# Patient Record
Sex: Male | Born: 1940 | Race: White | Hispanic: No | Marital: Married
Health system: Midwestern US, Academic
[De-identification: ages and names within clinical notes are randomized; demographics above are authoritative.]

---

## 2016-09-21 ENCOUNTER — Encounter: Admit: 2016-09-21 | Discharge: 2016-09-21 | Payer: MEDICARE

## 2016-09-30 ENCOUNTER — Encounter: Admit: 2016-09-30 | Discharge: 2016-09-30 | Payer: MEDICARE

## 2016-09-30 DIAGNOSIS — I10 Essential (primary) hypertension: ICD-10-CM

## 2016-09-30 DIAGNOSIS — E7800 Pure hypercholesterolemia, unspecified: ICD-10-CM

## 2016-09-30 DIAGNOSIS — I2510 Atherosclerotic heart disease of native coronary artery without angina pectoris: ICD-10-CM

## 2016-09-30 DIAGNOSIS — I25810 Atherosclerosis of coronary artery bypass graft(s) without angina pectoris: Principal | ICD-10-CM

## 2016-09-30 DIAGNOSIS — I4819 Other persistent atrial fibrillation: ICD-10-CM

## 2016-09-30 MED ORDER — NITROGLYCERIN 0.4 MG SL SUBL
.4 mg | ORAL_TABLET | SUBLINGUAL | 3 refills | 9.00000 days | Status: AC | PRN
Start: 2016-09-30 — End: 2019-02-26

## 2016-10-05 ENCOUNTER — Ambulatory Visit: Admit: 2016-10-05 | Discharge: 2016-10-06 | Payer: MEDICARE

## 2016-10-06 DIAGNOSIS — Z959 Presence of cardiac and vascular implant and graft, unspecified: Principal | ICD-10-CM

## 2016-10-10 ENCOUNTER — Encounter: Admit: 2016-10-10 | Discharge: 2016-10-10 | Payer: MEDICARE

## 2016-10-10 MED ORDER — AMIODARONE 200 MG PO TAB
ORAL_TABLET | ORAL | 3 refills | 42.00000 days | Status: AC
Start: 2016-10-10 — End: 2016-11-09

## 2016-11-04 ENCOUNTER — Ambulatory Visit: Admit: 2016-11-04 | Discharge: 2016-11-05 | Payer: MEDICARE

## 2016-11-05 DIAGNOSIS — I481 Persistent atrial fibrillation: Principal | ICD-10-CM

## 2016-11-05 DIAGNOSIS — Z959 Presence of cardiac and vascular implant and graft, unspecified: ICD-10-CM

## 2016-11-08 ENCOUNTER — Encounter: Admit: 2016-11-08 | Discharge: 2016-11-08 | Payer: MEDICARE

## 2016-11-09 ENCOUNTER — Encounter: Admit: 2016-11-09 | Discharge: 2016-11-09 | Payer: MEDICARE

## 2016-11-09 MED ORDER — AMIODARONE 200 MG PO TAB
400 mg | ORAL_TABLET | Freq: Every day | ORAL | 3 refills | 42.00000 days | Status: AC
Start: 2016-11-09 — End: 2017-01-23

## 2016-11-09 NOTE — Progress Notes
Date of Service: 11/09/2016    Kenneth Buchanan is a 76 y.o. male.       HPI     Kenneth Buchanan was seen in the office today for electrophysiology follow up.  He is routinely followed by Kenneth Buchanan.    He is a 76 y.o. male, with past medical history significant for hypertension, hyperlipidemia, CAD with history of CABG in 2003, and most recent PCI in May 2017, and recurrent paroxysmal atrial fibrillation.     All right Kenneth Buchanan has been doing exceedingly well since July 2017; however, was recently noted to have an increased A. fib burden with greater than 22 hours of A.Fib per day with the longest episode of 65 hours in June.  He is on amiodarone 200 mg 6 days per week totaling 1200 mg weekly.    Today, Kenneth Buchanan reports he has been feeling well.  He is unsure if he is symptomatic of his atrial fibrillation however may relate some recent fatigue and intermittent lightheadedness to his paroxysmal atrial fibrillation.  His wife is here with him today in which she states she can tell a difference in his activity level since May of this year.    He denies chest pain, chest pressure/discomfort, dyspnea, palpitations, irregular heart beats, near-syncope, syncope, orthopnea, paroxysmal nocturnal dyspnea, exertional chest pressure/discomfort, claudication, lower extremity edema, tachypnea.     ECG today shows atrial fibrillation at a rate of 63 bpm.             Vitals:    11/09/16 0841   BP: 128/78   Pulse: 63   Weight: 93.2 kg (205 lb 6.4 oz)   Height: 1.854 m (6' 1)     Body mass index is 27.1 kg/m???.     Past Medical History  Patient Active Problem List    Diagnosis Date Noted   ??? On continuous oral anticoagulation 10/07/2015   ??? Hematoma of [place] post TEE 10/05/2015     09/2015-on Triple anticoagulation     ??? Long term current use of amiodarone 09/29/2015     11/14: Initial (Pers) AF, amio 200/d-Kenneth Hannen  11/15 Decrease to 100/d  12/16 Pers AF-DC amio  7/17 Amio 200/d  3/18 Amio reduced to 1200/week ??? 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 Persistent atrial fibrillation (HCC) 09/03/2015   ??? CAD S/P percutaneous coronary angioplasty 09/03/2015   ??? 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 Holter monitor, abnormal 08/31/2015   ??? Chronic fatigue 07/27/2015   ??? At risk for stroke 03/24/2015   ??? 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 Neck mass 02/20/2014   ??? PAF (paroxysmal atrial fibrillation) (HCC) 02/07/2013     02/21/13 TEE: EF 50%. No evidence of intracardiac thrombus.   02/21/13 DCCV: Successful DC cardioversion of Atrial fibrillation to sinus rhythm.   08/05/15 ZioPatch: Underlying rhythm was atrial fibrillation 100% of the time. Average heart rate is 68 bpm, patient is not on a beta-blocker. Episodes of tachycardia up to 201 bpm were noted. One 3 second pause occurred. Isolated ventricular tachycardia occurred, the longest episode lasted 5.2 seconds.??? In addition isolated ventricular ectopy was present and trigeminy.     ??? Essential hypertension 07/14/2011   ??? 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 Coronary artery disease involving coronary bypass  graft of native heart without angina pectoris 01/05/2009     Coronary artery disease, status post percutaneous coronary intervention of saphenous      vein graft to right coronary artery on 10/26/2007 at [facility].  Personal history of premature coronary artery disease -- The patient underwent his      first percutaneous coronary intervention at age 56  08/03/15 Echo: EF 60%. Mild MR. PAP 20 mmHg.   08/05/15 Bruce thallium: This study is abnormal due to calculated lower left ventricular ejection fraction of 42%. This is likely due to the patient's rapid ventricular rates noted with exercise stress. His peak heart rate achieved was 204 beats per minute which was 140% predicted heart rate for age. This is due to rapid ventricular response with atrial fibrillation. There is no definite significant inducible ischemia present. The patient did have dyspnea with physical activity and he was able to exercise for a maximal duration of 7 minutes 24 seconds. ???  09/03/15 LHC: Hemodynamically significant in-stent restenotic lesion in previously placed BMS to venous graft to RCA. Strongly positive fractional flow reserve assessment with reduction in FFR value from 0.94 to 0.72 within 45 seconds. Successful PCI of in-stent restenotic lesion in SVG to RCA with [id number] Herculink Elite stent with excellent angiographic results.      ??? Hyperlipidemia 01/05/2009     Hyperlipidemia, on Crestor -- LDL is almost at goal.     ??? Restless leg syndrome 01/05/2009   ??? Family history of early CAD 01/05/2009     Family history of premature coronary artery disease -- The patient's brother died of      sudden cardiac death and another brother had coronary artery bypass graft at age 21.           Review of Systems   Constitution: Positive for malaise/fatigue.   HENT: Positive for hearing loss and tinnitus.    Eyes: Positive for vision loss in right eye.   Cardiovascular: Positive for irregular heartbeat and palpitations.   Respiratory: Negative.    Endocrine: Negative.    Hematologic/Lymphatic: Bruises/bleeds easily.   Skin: Positive for skin cancer.   Musculoskeletal: Positive for arthritis, joint pain, muscle weakness, myalgias and stiffness.   Gastrointestinal: Negative.    Genitourinary: Negative.    Neurological: Positive for light-headedness.   Psychiatric/Behavioral: Positive for depression. The patient has insomnia and is nervous/anxious.    Allergic/Immunologic: Negative.        Physical Exam   Constitutional: He appears well-developed and well-nourished.   HENT:   Head: Normocephalic and atraumatic.   Eyes: EOM are normal. Pupils are equal, round, and reactive to light. Neck: Normal range of motion. Neck supple.   Cardiovascular: Normal rate and normal heart sounds.  An irregularly irregular rhythm present.   Pulmonary/Chest: Effort normal and breath sounds normal. No respiratory distress. He has no wheezes. He has no rales.   Abdominal: Soft. Bowel sounds are normal. He exhibits no distension. There is no tenderness.   Musculoskeletal: Normal range of motion. He exhibits no edema.   Neurological: He is alert and oriented to person, place, and time.   Skin: Skin is warm and dry. No erythema.   Psychiatric: He has a normal mood and affect. His behavior is normal.       Problems Addressed Today  Encounter Diagnoses   Name Primary?   ??? Other hyperlipidemia Yes   ??? S/P CABG x 2    ??? Essential hypertension    ???  PAF (paroxysmal atrial fibrillation) (HCC)    ??? Nonsustained ventricular tachycardia (HCC)    ??? Coronary artery disease involving coronary bypass graft of native heart without angina pectoris    ??? Tachycardia-bradycardia (HCC)    ??? Persistent atrial fibrillation (HCC)    ??? CAD S/P percutaneous coronary angioplasty        Assessment and Plan     The patient is having in AFib now, and has been having paroxysmal atrial fibrillation per his device interrogation today with an increased burden since May.  He is currently on amiodarone 200 mg 6 days per week with a total of 1200 mg weekly.  Today, we are going to increase his amiodarone to 400 mg daily for 2 weeks, and then decrease his dose down to 300 mg 6 days weekly for a total of 1800 mg per week.  We will also repeat his amiodarone labs today.  He is planning to follow-up with Kenneth. Clint Buchanan in October in which we will evaluate his overall burden at that time.         Current Medications (including today's revisions)  ??? acetaminophen (TYLENOL) 500 mg tablet Take 1,000 mg by mouth as Needed for Pain.   ??? amiodarone (CORDARONE) 200 mg tablet Take 2 tablets by mouth daily. For 4 weeks, then 1.5 tabs (300mg ) every day for 6 days per week thereafter. Take with food.   ??? aspirin EC 81 mg tablet Take 1 Tab by mouth daily. Take with food.   ??? atorvastatin (LIPITOR) 80 mg tablet Take 1 tablet by mouth daily. (Patient taking differently: Take 40 mg by mouth daily.)   ??? levothyroxine (SYNTHROID) 137 mcg tablet Take 137 mcg by mouth daily 30 minutes before breakfast. Indications: HYPOTHYROIDISM   ??? magnesium chloride (MAG DELAY) 535 mg (64mg  elemental) tablet Take 535 mg by mouth daily.   ??? nitroglycerin (NITROSTAT) 0.4 mg tablet Place 1 tablet under tongue every 5 minutes as needed for Chest Pain. Max of 3 tablets, call 911.   ??? timolol XE(+) (TIMOPTIC-XR) 0.5 % ophthalmic gel Place 1 drop into or around eye(s) daily.   ??? vitamins, multiple tablet Take 1 Tab by mouth daily.   ??? XARELTO 20 mg tablet TAKE ONE TABLET BY MOUTH ONCE DAILY WITH FOOD

## 2016-11-09 NOTE — Telephone Encounter
Patient was seen in clinic today (7/25) by Jeani Sow.  After the patient's OV, Rachael talked with LDB & it was decided to change the patient's plan of care as follows...  Draw CMP, TSH, T4, CBC  Amiodarone 400qd x 4 weeks, then decrease to 300qd for 6 days per week (1800mg /week) thereafter  F/u LDB OV in 3 months    Called patient.  Informed him of above.  He wishes to get labs drawn at [place].  He wants his amiodarone filled at [facility].  He states he will call our scheduling phone number to schedule f/u [facility] phone number given.    Labs faxed to [facility] at [phone].  Rx sent.  8/14 LDB OV cancelled.

## 2016-11-10 ENCOUNTER — Ambulatory Visit: Admit: 2016-11-09 | Discharge: 2016-11-10 | Payer: MEDICARE

## 2016-11-10 DIAGNOSIS — E784 Other hyperlipidemia: ICD-10-CM

## 2016-11-10 DIAGNOSIS — Z9861 Coronary angioplasty status: ICD-10-CM

## 2016-11-10 DIAGNOSIS — I25810 Atherosclerosis of coronary artery bypass graft(s) without angina pectoris: ICD-10-CM

## 2016-11-10 DIAGNOSIS — I495 Sick sinus syndrome: ICD-10-CM

## 2016-11-10 DIAGNOSIS — I10 Essential (primary) hypertension: ICD-10-CM

## 2016-11-10 DIAGNOSIS — I480 Paroxysmal atrial fibrillation: Principal | ICD-10-CM

## 2016-11-10 DIAGNOSIS — I472 Ventricular tachycardia: ICD-10-CM

## 2016-11-10 DIAGNOSIS — I481 Persistent atrial fibrillation: ICD-10-CM

## 2016-11-10 DIAGNOSIS — I2510 Atherosclerotic heart disease of native coronary artery without angina pectoris: ICD-10-CM

## 2016-11-10 DIAGNOSIS — Z951 Presence of aortocoronary bypass graft: ICD-10-CM

## 2016-11-10 LAB — CBC
Lab: 12 — ABNORMAL LOW (ref 14.0–18.0)
Lab: 225
Lab: 38 — ABNORMAL LOW (ref 42.0–52.0)
Lab: 4 — ABNORMAL LOW (ref 4.70–6.10)
Lab: 5.2

## 2016-11-10 LAB — FREE T4 (FREE THYROXINE) ONLY: Lab: 1.2

## 2016-11-10 LAB — COMPREHENSIVE METABOLIC PANEL
Lab: 1
Lab: 11
Lab: 139
Lab: 26
Lab: 4

## 2016-11-10 LAB — THYROID STIMULATING HORMONE-TSH: Lab: 2.7

## 2016-11-11 ENCOUNTER — Encounter: Admit: 2016-11-11 | Discharge: 2016-11-11 | Payer: MEDICARE

## 2016-11-11 DIAGNOSIS — I25810 Atherosclerosis of coronary artery bypass graft(s) without angina pectoris: ICD-10-CM

## 2016-11-11 DIAGNOSIS — I4819 Other persistent atrial fibrillation: ICD-10-CM

## 2016-11-11 DIAGNOSIS — E7849 Other hyperlipidemia: Principal | ICD-10-CM

## 2016-11-11 DIAGNOSIS — I472 Ventricular tachycardia: ICD-10-CM

## 2016-11-11 DIAGNOSIS — I480 Paroxysmal atrial fibrillation: ICD-10-CM

## 2016-11-11 DIAGNOSIS — I495 Sick sinus syndrome: ICD-10-CM

## 2016-11-11 DIAGNOSIS — I2510 Atherosclerotic heart disease of native coronary artery without angina pectoris: ICD-10-CM

## 2016-11-11 DIAGNOSIS — I10 Essential (primary) hypertension: ICD-10-CM

## 2016-11-11 DIAGNOSIS — Z951 Presence of aortocoronary bypass graft: ICD-10-CM

## 2016-11-17 ENCOUNTER — Ambulatory Visit: Admit: 2016-11-17 | Discharge: 2016-11-17 | Payer: MEDICARE

## 2016-11-17 ENCOUNTER — Encounter: Admit: 2016-11-17 | Discharge: 2016-11-17 | Payer: MEDICARE

## 2016-11-17 DIAGNOSIS — C4491 Basal cell carcinoma of skin, unspecified: ICD-10-CM

## 2016-11-17 DIAGNOSIS — I4891 Unspecified atrial fibrillation: ICD-10-CM

## 2016-11-17 DIAGNOSIS — E785 Hyperlipidemia, unspecified: Principal | ICD-10-CM

## 2016-11-17 DIAGNOSIS — I2510 Atherosclerotic heart disease of native coronary artery without angina pectoris: Secondary | ICD-10-CM

## 2016-11-17 DIAGNOSIS — K219 Gastro-esophageal reflux disease without esophagitis: ICD-10-CM

## 2016-11-17 DIAGNOSIS — H5440 Blindness, one eye, unspecified eye: ICD-10-CM

## 2016-11-17 DIAGNOSIS — I4819 Other persistent atrial fibrillation: ICD-10-CM

## 2016-11-17 DIAGNOSIS — C73 Malignant neoplasm of thyroid gland: Principal | ICD-10-CM

## 2016-11-17 DIAGNOSIS — I480 Paroxysmal atrial fibrillation: ICD-10-CM

## 2016-11-17 DIAGNOSIS — H9190 Unspecified hearing loss, unspecified ear: ICD-10-CM

## 2016-11-17 DIAGNOSIS — I10 Essential (primary) hypertension: ICD-10-CM

## 2016-11-17 DIAGNOSIS — G2581 Restless legs syndrome: ICD-10-CM

## 2016-11-17 DIAGNOSIS — M1990 Unspecified osteoarthritis, unspecified site: ICD-10-CM

## 2016-11-17 NOTE — Telephone Encounter
I contacted patient. He began amiodarone 7/26.  Started on 400mg  BID for 4 weeks.   He is without symptoms. He will see LDB in 3 months along with stress test and ILR check 10/8

## 2016-11-17 NOTE — Progress Notes
Date of Service: 11/17/2016    Subjective:             Kenneth Buchanan is a 76 y.o. male.    History of Present Illness  Kenneth Buchanan is s/p sistrunk procedure with excision of thyroglossal duct cyst and DL on 1/61/09. 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 He then underwent total thyroid with central neck dissection 08/06/2014. Final path with papillary microcarcinoma. Tumor board recommendations 08/25/2014 for synthroid suppression, baseline serology and US thyroid to determine biologic behavior. Last seen 11/12/15.  ???  Following with Dr. Kirtland Bouchard in Endo. Last [place] 10/2015 with no evidence of disease. No subjective lymphadenopathy, changes to weight, vision loss, headache, neck pain, odynophagia, otalgia. Speech and swallow OK today. No masses. NO NEW MALIGNANT SYMPTOMS  ???  Had cardioversion for Afib in June 2017. Was in sinus rhythm for a while but now back in Afib. Remains on amiodoarone. On Xarelto for prior angioplasty with bare metal stents. Also had a knee replacement in November. A month ago he had a fractured knee cap. NO OTHER MAJOR INTERVAL MEDICAL PROBLEMS.  ?????????  ???Results for DMONI, FORTSON (MRN [id number]) as of 11/17/2016 09:27   Ref. Range 10/16/2015 09:39 10/16/2015 09:39 11/23/2015 10:39 01/14/2016 08:50 06/23/2016 09:10 11/10/2016 00:00   T4-Free Unknown 1.28 1.28  1.6  1.25   TSH Unknown 2.95 2.95  3.006  2.74   Thyroglobulin Ab Latest Ref Range: <4.11 IU/ML    <3.00     Thyroglobulin Latest Ref Range: 1.7 - 55.0 NG/ML    0.3 (L)       US Thyroid 10/2015  IMPRESSION    Prior thyroidectomy without evidence of local tumor recurrence or nodal   metastatic disease.    Pathology review:  ########################################################################  08/06/14 Final Diagnosis:    A. Thyroid, left thyroid, lobectomy:   Papillary microcarcinoma (0.6 cm). See comment    B. Lymph nodes (10), central neck dissection, dissection: There is no evidence of malignancy in 10 lymph nodes. (0/10)    C. Thyroid, right thyroid, lobectomy:   No diagnostic abnormalities.  There is no evidence of malignancy.   ???  ???  04/30/14 PATH:  A. 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   Previously ruptured thyroglossal duct cyst, with necrosis, chronic  inflammation and fibrosis of the cyst wall.          Review of Systems   Constitutional: Negative.    HENT: Negative.    Eyes: Negative.    Respiratory: Negative.    Cardiovascular: Negative.    Gastrointestinal: Negative.    Endocrine: Negative.    Genitourinary: Negative.    Musculoskeletal: Positive for arthralgias.   Skin: Negative.    Allergic/Immunologic: Negative.    Neurological: Negative.    Hematological: Negative.    Psychiatric/Behavioral: Negative.    All other systems reviewed and are negative.        Objective:         ??? acetaminophen (TYLENOL) 500 mg tablet Take 1,000 mg by mouth as Needed for Pain.   ??? amiodarone (CORDARONE) 200 mg tablet Take 2 tablets by mouth daily. For 4 weeks, then 1.5 tabs (300mg ) every day for 6 days per week thereafter. Take with food.   ??? aspirin EC 81 mg tablet Take 1 Tab by mouth daily. Take with food.   ??? atorvastatin (LIPITOR) 80  mg tablet Take 1 tablet by mouth daily. (Patient taking differently: Take 40 mg by mouth daily.)   ??? levothyroxine (SYNTHROID) 137 mcg tablet Take 137 mcg by mouth daily 30 minutes before breakfast. Indications: HYPOTHYROIDISM   ??? magnesium chloride (MAG DELAY) 535 mg (64mg  elemental) tablet Take 535 mg by mouth daily.   ??? nitroglycerin (NITROSTAT) 0.4 mg tablet Place 1 tablet under tongue every 5 minutes as needed for Chest Pain. Max of 3 tablets, call 911.   ??? timolol XE(+) (TIMOPTIC-XR) 0.5 % ophthalmic gel Place 1 drop into or around eye(s) daily.   ??? vitamins, multiple tablet Take 1 Tab by mouth daily. ??? XARELTO 20 mg tablet TAKE ONE TABLET BY MOUTH ONCE DAILY WITH FOOD     Vitals:    11/17/16 0919   BP: 134/87   Pulse: 57   Weight: 93.5 kg (206 lb 3.2 oz)   Height: 185.4 cm (73)     Body mass index is 27.2 kg/m???.     Physical Exam  Head: Normocephalic/atraumatic. THIN  Ears: AU Auricles without lesions, EAC with cerumen AU, hearing grossly intact AU   Eyes: EOMs equal OU, PERRL, vision grossly intact OU, conjunctiva clear   Nose: Externally without lesions, septum midline, no visible lesions via anterior rhinoscopy   OC/OP: Teeth in good repair, tongue movement and sensation intact, no mass or mucosal lesions in OC or OP visibly or palpably, tonsils symmetric. Palate well healed, no residual hematoma.   Larynx: Voice normal without stridor or sturgor, normal external landmarks   Neck: No palpable thyroid masses, no cutaneous changes. Incisions healing well. BARELY VISIBLE  CNs: Facial sensation and movement intact bilaterally, shoulder shrug normal bilaterally  ???       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 Overall doing well. Dr. Kirtland Bouchard managing thyroid hormone. Persistent Afib and follows with cardiology. US thyroid ordered by Dr. Kirtland Bouchard but has not been done. They were also unsure about timing of follow up with Dr. Kirtland Bouchard. Her last note says 12 months. They will call her to schedule follow up and discuss timing of US Thyroid. NO RECENT EROLOGY. NED today AND FUNCTIONALLY AND CLINICALLY STABLE. RTC in 12 months. DR Lorenza Evangelist SURVEILLANCE                      ATTESTATION    I have reviewed and agree with the chief complaint    I personally performed all or the key portions of the E/M visit, discussed the case with the resident and concur with the resident documentation of the history, physical exam, assessment, and treatment plan unless otherwise noted. I was either present or personally performed all the components of the visit. All documentation in CAPS (except Radiology Reports) represents my changes to the assessment, exam and recommendations for treatment. Documentation not in CAPS was provided by the resident.     Staff name:  Adonis Housekeeper, MD Date:  11/17/2016

## 2016-11-17 NOTE — Telephone Encounter
-----   Message from Springer sent at 11/17/2016  1:05 PM CDT -----  Regarding: ldb linq  Presenting EGM: 11/17/16 @ 0004 AF 40's-70's    Event report received and reviewed,  AF burden > threshold

## 2016-11-22 ENCOUNTER — Encounter: Admit: 2016-11-22 | Discharge: 2016-11-22 | Payer: MEDICARE

## 2016-12-05 ENCOUNTER — Ambulatory Visit: Admit: 2016-12-05 | Discharge: 2016-12-05 | Payer: MEDICARE

## 2016-12-05 DIAGNOSIS — Z959 Presence of cardiac and vascular implant and graft, unspecified: ICD-10-CM

## 2016-12-05 DIAGNOSIS — I481 Persistent atrial fibrillation: Principal | ICD-10-CM

## 2016-12-06 ENCOUNTER — Ambulatory Visit: Admit: 2016-12-06 | Discharge: 2016-12-06 | Payer: MEDICARE

## 2016-12-06 DIAGNOSIS — C73 Malignant neoplasm of thyroid gland: Principal | ICD-10-CM

## 2016-12-07 ENCOUNTER — Encounter: Admit: 2016-12-07 | Discharge: 2016-12-07 | Payer: MEDICARE

## 2016-12-07 NOTE — Telephone Encounter
-----   Message from Kake sent at 12/07/2016 12:42 PM CDT -----  Regarding: ldb linq  Presenting EGM: 12/07/16 @ 0004 AF 40's-70's.    Event report received and reviewed, AT/AF burden > threshold. Current AF burden 98.9%  will continue to monitor.    Results routed to Dr. Artist Beach for signature and review.  _____________________________________________________________________________    Madaline Brilliant to turn off AF care alerts?

## 2016-12-07 NOTE — Telephone Encounter
AF burden unchanged this month.  Continue to monitor. Next OV in 10/8 with device check

## 2016-12-14 ENCOUNTER — Encounter: Admit: 2016-12-14 | Discharge: 2016-12-14 | Payer: MEDICARE

## 2016-12-14 NOTE — Telephone Encounter
-----   Message from Army Melia sent at 12/14/2016  3:09 PM CDT -----  Regarding: LDB LINQ pt with brady event (AF)  Presenting EGM on 12/14/2016 @ 00:04:50 shows an irregular R-R interval with ectopy..     Reviewed Event Report shows:    #66 Loletha Grayer 8/25 @ 05:10 lasting 22 sec shows AF with V rates between <30's-50's.    Please see scanned data sheets for further review.  Results routed to Dr. Artist Beach for signature and review.

## 2016-12-14 NOTE — Telephone Encounter
SB episode was at 5:10 AM.  I attempted to call patient. He was outdoors and could not hear me when he answered the phone.   We will continue to monitor.

## 2017-01-02 ENCOUNTER — Encounter: Admit: 2017-01-02 | Discharge: 2017-01-02 | Payer: MEDICARE

## 2017-01-02 DIAGNOSIS — E890 Postprocedural hypothyroidism: Principal | ICD-10-CM

## 2017-01-02 MED ORDER — LEVOTHYROXINE 137 MCG PO TAB
137 ug | ORAL_TABLET | Freq: Every day | ORAL | 1 refills | 30.00000 days | Status: AC
Start: 2017-01-02 — End: 2017-04-01

## 2017-01-02 NOTE — Telephone Encounter
Pharmacy e-scribed refill request for levothyroxine; completed per standing order protocol.

## 2017-01-04 ENCOUNTER — Ambulatory Visit: Admit: 2017-01-04 | Discharge: 2017-01-05 | Payer: MEDICARE

## 2017-01-05 DIAGNOSIS — I481 Persistent atrial fibrillation: Principal | ICD-10-CM

## 2017-01-05 DIAGNOSIS — Z959 Presence of cardiac and vascular implant and graft, unspecified: ICD-10-CM

## 2017-01-17 ENCOUNTER — Encounter: Admit: 2017-01-17 | Discharge: 2017-01-17 | Payer: MEDICARE

## 2017-01-17 MED ORDER — RIVAROXABAN 20 MG PO TAB
20 mg | ORAL_TABLET | Freq: Every day | ORAL | 11 refills | 30.00000 days | Status: AC
Start: 2017-01-17 — End: 2017-12-05

## 2017-01-19 ENCOUNTER — Encounter: Admit: 2017-01-19 | Discharge: 2017-01-19 | Payer: MEDICARE

## 2017-01-19 DIAGNOSIS — I10 Essential (primary) hypertension: ICD-10-CM

## 2017-01-19 DIAGNOSIS — I472 Ventricular tachycardia: ICD-10-CM

## 2017-01-19 DIAGNOSIS — Z951 Presence of aortocoronary bypass graft: Principal | ICD-10-CM

## 2017-01-19 DIAGNOSIS — I25810 Atherosclerosis of coronary artery bypass graft(s) without angina pectoris: ICD-10-CM

## 2017-01-19 DIAGNOSIS — Z8249 Family history of ischemic heart disease and other diseases of the circulatory system: ICD-10-CM

## 2017-01-19 DIAGNOSIS — I480 Paroxysmal atrial fibrillation: ICD-10-CM

## 2017-01-19 DIAGNOSIS — E7849 Other hyperlipidemia: ICD-10-CM

## 2017-01-19 NOTE — Progress Notes
Hi ,  This is Doris w/ Public house manager. I just reviewed Prep for Thall Stress on Mon 10/8 w/ pt [facility] MRN [id number]. Pt expresses wanting to do Bruce vs Regadenoson Thall. The order should be changed if LDB ok's the change. Thanks !        Discussed with LDB, pt has done reg thal in past. Has had total knee replacement. No BBB. Ok to switch to bruce BUT per LDB pt to follow reg thal instructions so if he cannot do TM then he can do reg thal instead same day. Sent flag to nuc staff with update and order entered.

## 2017-01-23 ENCOUNTER — Encounter: Admit: 2017-01-23 | Discharge: 2017-01-23 | Payer: MEDICARE

## 2017-01-23 ENCOUNTER — Ambulatory Visit: Admit: 2017-01-23 | Discharge: 2017-01-23 | Payer: MEDICARE

## 2017-01-23 ENCOUNTER — Ambulatory Visit: Admit: 2017-01-23 | Discharge: 2017-01-24 | Payer: MEDICARE

## 2017-01-23 DIAGNOSIS — I2510 Atherosclerotic heart disease of native coronary artery without angina pectoris: Secondary | ICD-10-CM

## 2017-01-23 DIAGNOSIS — I25810 Atherosclerosis of coronary artery bypass graft(s) without angina pectoris: ICD-10-CM

## 2017-01-23 DIAGNOSIS — Z8249 Family history of ischemic heart disease and other diseases of the circulatory system: ICD-10-CM

## 2017-01-23 DIAGNOSIS — I472 Ventricular tachycardia: ICD-10-CM

## 2017-01-23 DIAGNOSIS — Z9861 Coronary angioplasty status: ICD-10-CM

## 2017-01-23 DIAGNOSIS — Z951 Presence of aortocoronary bypass graft: Secondary | ICD-10-CM

## 2017-01-23 DIAGNOSIS — I10 Essential (primary) hypertension: ICD-10-CM

## 2017-01-23 DIAGNOSIS — I480 Paroxysmal atrial fibrillation: ICD-10-CM

## 2017-01-23 DIAGNOSIS — E7849 Other hyperlipidemia: ICD-10-CM

## 2017-01-23 DIAGNOSIS — E782 Mixed hyperlipidemia: ICD-10-CM

## 2017-01-23 DIAGNOSIS — M1990 Unspecified osteoarthritis, unspecified site: ICD-10-CM

## 2017-01-23 DIAGNOSIS — H5440 Blindness, one eye, unspecified eye: ICD-10-CM

## 2017-01-23 DIAGNOSIS — E785 Hyperlipidemia, unspecified: Principal | ICD-10-CM

## 2017-01-23 DIAGNOSIS — I4819 Other persistent atrial fibrillation: ICD-10-CM

## 2017-01-23 DIAGNOSIS — C73 Malignant neoplasm of thyroid gland: ICD-10-CM

## 2017-01-23 DIAGNOSIS — I481 Persistent atrial fibrillation: ICD-10-CM

## 2017-01-23 DIAGNOSIS — C4491 Basal cell carcinoma of skin, unspecified: ICD-10-CM

## 2017-01-23 DIAGNOSIS — G2581 Restless legs syndrome: ICD-10-CM

## 2017-01-23 DIAGNOSIS — H9190 Unspecified hearing loss, unspecified ear: ICD-10-CM

## 2017-01-23 DIAGNOSIS — K219 Gastro-esophageal reflux disease without esophagitis: ICD-10-CM

## 2017-01-23 DIAGNOSIS — I4891 Unspecified atrial fibrillation: ICD-10-CM

## 2017-01-23 MED ORDER — AMIODARONE 200 MG PO TAB
300 mg | ORAL_TABLET | Freq: Every day | ORAL | 3 refills | 42.00000 days | Status: AC
Start: 2017-01-23 — End: 2018-04-24

## 2017-01-23 MED ORDER — NITROGLYCERIN 0.4 MG SL SUBL
.4 mg | SUBLINGUAL | 0 refills | Status: AC | PRN
Start: 2017-01-23 — End: ?

## 2017-01-23 MED ORDER — SODIUM CHLORIDE 0.9 % IV SOLP
250 mL | INTRAVENOUS | 0 refills | Status: AC | PRN
Start: 2017-01-23 — End: ?

## 2017-01-23 NOTE — Progress Notes
Peripheral IV Insertion Note:  Patient Side: left  Line Orientation:Antecubital  IV Catheter Size: 20G  Number of Attempts:1.  IV capped and flushed with Normal Saline.  IV site without redness, swelling, or pain.  New dressing placed.    After procedure IV cannula removed intact and hemostasis achieved.

## 2017-01-24 ENCOUNTER — Encounter: Admit: 2017-01-24 | Discharge: 2017-01-24 | Payer: MEDICARE

## 2017-01-24 NOTE — Progress Notes
Thallium stress test: Prior bypass and angioplasty.    His EF on this study is 52%.  It was 41% on his prior thallium.  This study does not suggest significant ischemia and that is stable compared with his prior study    Overall his EF is low normal and he is nonischemic.  This is great news    He is scheduled for a cardioversion (out of A. fib) in the near future.

## 2017-01-24 NOTE — Progress Notes
Medicare is listed as patient's primary insurance coverage.  Pre-certification is not required for hospitalizations.

## 2017-02-02 ENCOUNTER — Encounter: Admit: 2017-02-02 | Discharge: 2017-02-02 | Payer: MEDICARE

## 2017-02-02 NOTE — Telephone Encounter
Spoke with patient on the phone. Pt confirmed date/time of cardioversion and knows where to check in at. Pt states he has not missed any doses of xarelto. Pt will have driver with him. NPO after midnight, sips of water okay with morning meds. No additional questions at this time.

## 2017-02-03 ENCOUNTER — Ambulatory Visit: Admit: 2017-02-03 | Discharge: 2017-02-03 | Payer: MEDICARE

## 2017-02-03 DIAGNOSIS — I480 Paroxysmal atrial fibrillation: Principal | ICD-10-CM

## 2017-02-03 LAB — POC POTASSIUM: Lab: 3.8 MMOL/L (ref 3.5–5.1)

## 2017-02-03 MED ORDER — ONDANSETRON HCL (PF) 4 MG/2 ML IJ SOLN
4 mg | Freq: Once | INTRAVENOUS | 0 refills | Status: CN | PRN
Start: 2017-02-03 — End: ?

## 2017-02-03 MED ORDER — FENTANYL CITRATE (PF) 50 MCG/ML IJ SOLN
12.5-25 ug | INTRAVENOUS | 0 refills | Status: CN | PRN
Start: 2017-02-03 — End: ?

## 2017-02-03 MED ORDER — SODIUM CHLORIDE 0.9 % IV SOLP (OR) 500ML
0 refills | Status: DC
Start: 2017-02-03 — End: 2017-02-03
  Administered 2017-02-03: 18:00:00 via INTRAVENOUS

## 2017-02-03 MED ORDER — PROPOFOL INJ 10 MG/ML IV VIAL
0 refills | Status: DC
Start: 2017-02-03 — End: 2017-02-03
  Administered 2017-02-03: 18:00:00 20 mg via INTRAVENOUS
  Administered 2017-02-03 (×2): 30 mg via INTRAVENOUS

## 2017-02-03 NOTE — Anesthesia Post-Procedure Evaluation
Post-Anesthesia Evaluation    Name: Kenneth Buchanan      MRN: [id number]     DOB: Jul 23, 1940     Age: 77 y.o.     Sex: male   __________________________________________________________________________     Procedure Date: 02/03/2017  Procedure: * No procedures listed *      Surgeon: * No surgeons listed *    Post-Anesthesia Vitals  BP: 124/69 (10/19 1336)  Pulse: 49 (10/19 1336)  Respirations: 14 PER MINUTE (10/19 1336)  SpO2: 100 % (10/19 1336)  O2 Delivery: Nasal Cannula (10/19 1336)  Height: 182.9 cm (72") (10/19 1252)      Post Anesthesia Evaluation Note    Evaluation location: other  Patient participation: recovered; patient participated in evaluation  Level of consciousness: alert    Pain score: 0  Pain management: adequate    Hydration: normovolemia  Temperature: 36.0C - 38.4C  Airway patency: adequate    Perioperative Events  Perioperative events:  no       Post-op nausea and vomiting: no PONV    Postoperative Status  Cardiovascular status: hemodynamically stable  Respiratory status: spontaneous ventilation        Perioperative Events  Perioperative Event: No  Emergency Case Activation: No

## 2017-02-03 NOTE — Progress Notes
Pre-Operative Assessment for TEE or Cardioversion    Date of Service:  02/03/2017  Kenneth Buchanan is a 76 y.o. y.o. male.     DOB: 1940/09/24                  MRN#:  [id number]      Procedure to be performed: Cardioversion  Expected Procedure Date:  02/03/17  Indication: atrial fibrillation     Patient appears alert and oriented: Yes  NPO: for greater than 8 hours  Inpatient IV status: None outpatient    Isolation status: None  Last TEE date: 09/28/15  Last Cardioversion date: 12/03/15      Anticoagulation Results   Anticoagulant: rivaroxaban (Xarelto)  Missed dose: No    Last MAC INR Flow Sheet Entry:    Last recorded Lab results:   No results found for: INR  APTT   Date Value Ref Range Status   09/04/2015 55.6 (H) 24.0 - 40.0 SEC Final           Allergies                                        Allergies   Allergen Reactions   ??? Valium [Diazepam] UNKNOWN and SEE COMMENTS     During a procedure something happened after getting IV Valium. Pt had to receive a shot b/c of it.          Vitals  Estimated body mass index is 27.98 kg/m??? as calculated from the following:    Height as of 01/23/17: 1.854 m (6' 0.99).    Weight as of 01/23/17: 96.2 kg (212 lb).      Diagnostic Tests  White Blood Cells   Date Value Ref Range Status   11/10/2016 5.2  Final     Hemoglobin   Date Value Ref Range Status   11/10/2016 12.6 (L) 14.0 - 18.0 Final     Hematocrit   Date Value Ref Range Status   11/10/2016 38.6 (L) 42.0 - 52.0 Final     Platelet Count   Date Value Ref Range Status   11/10/2016 225  Final     Sodium   Date Value Ref Range Status   11/10/2016 139  Final     Potassium   Date Value Ref Range Status   11/10/2016 4.0  Final     Magnesium   Date Value Ref Range Status   09/29/2015 2.1 1.6 - 2.6 mg/dL Final     Blood Urea Nitrogen   Date Value Ref Range Status   11/10/2016 18.0  Final     Creatinine   Date Value Ref Range Status   11/10/2016 1.09  Final     Glucose   Date Value Ref Range Status   11/10/2016 104  Final Blood Cultures  Resulted Micro Last 72 Hrs    No results found             Echo procedures within the past 30 days:  No results found.        Device Information on File  Lab Results   Component Value Date/Time    GENERATOR Medtronic 01/23/2017 12:34 PM    EPDEVTYP ILR 01/23/2017 12:34 PM         Current Medications  Current Outpatient Prescriptions on File Prior to Encounter   Medication Sig Dispense Refill   ??? acetaminophen (TYLENOL) 500 mg  tablet Take 1,000 mg by mouth as Needed for Pain.     ??? amiodarone (CORDARONE) 200 mg tablet Take 1.5 tablets by mouth daily. 180 tablet 3   ??? aspirin EC 81 mg tablet Take 1 Tab by mouth daily. Take with food. 90 Tab 3   ??? atorvastatin (LIPITOR) 80 mg tablet Take 1 tablet by mouth daily. (Patient taking differently: Take 40 mg by mouth daily.) 90 tablet 3   ??? levothyroxine (SYNTHROID) 137 mcg tablet Take one hundred thirty seven mcg by mouth daily 30 minutes before breakfast. 90 tablet 1   ??? magnesium chloride (MAG DELAY) 535 mg (64mg  elemental) tablet Take 535 mg by mouth daily.     ??? nitroglycerin (NITROSTAT) 0.4 mg tablet Place 1 tablet under tongue every 5 minutes as needed for Chest Pain. Max of 3 tablets, call 911. 25 tablet 3   ??? rivaroxaban (XARELTO) 20 mg tablet Take one tablet by mouth daily with dinner. Take with food. 30 tablet 11   ??? timolol XE(+) (TIMOPTIC-XR) 0.5 % ophthalmic gel Place 1 drop into or around eye(s) daily.     ??? vitamins, multiple tablet Take 1 Tab by mouth daily.       No current facility-administered medications on file prior to encounter.          Past Medical History  Past Medical History:   Diagnosis Date   ??? Arthritis    ??? Atrial fibrillation (HCC)     s/p cardioversion 2014   ??? Basal cell carcinoma    ??? CAD (coronary artery disease) 1992   ??? CAD (coronary artery disease), native coronary artery 2009    Coronary artery disease, status post percutaneous coronary intervention of saphenous     vein graft to right coronary artery on 10/26/2007 at [facility]. Personal history of premature coronary artery disease -- The patient underwent his     first percutaneous coronary intervention at age 42.    ??? Essential hypertension 07/14/2011   ??? GERD (gastroesophageal reflux disease)     GERD   ??? Hearing loss    ??? Hyperlipidemia    ??? 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     ??? PAF (paroxysmal atrial fibrillation) (HCC) 02/07/2013   ??? Persistent atrial fibrillation (HCC) 08/2015   ??? Restless leg syndrome    ??? Thyroid cancer [facility])          Past Surgical History  Past Surgical History:   Procedure Laterality Date   ??? COLONOSCOPY  1980   ??? CORONARY ARTERY BYPASS GRAFT  1992    CABG times 2   ??? CORONARY STENT PLACEMENT  2010    stent times 1   ??? RETINAL DETACHMENT REPAIR  2012    times 3   ??? CARDIOVERSION  2013   ??? CYSTECTOMY  04/2014    thyroid   ??? PR THYROIDECTOMY TOTAL/COMPLETE Bilateral 08/06/2014    THYROIDECTOMY performed by Adonis Housekeeper, MD at [facility]   ??? PR LARYNGOSCOPY W/WO TRACHEOSCOPY DX EXCEPT NEWBORN N/A 08/06/2014    DIRECT LARYNGOSCOPY performed by Adonis Housekeeper, MD at [facility]   ??? PR NEEDLE ELECTROMYOGRAPHY LARYNX N/A 08/06/2014    RECURRENT LARYNGEAL NERVE MONITORING performed by Adonis Housekeeper, MD at [facility]   ??? PR LARYNGOSCOPY FLEXIBLE DIAGNOSTIC N/A 08/06/2014    FLEXIBLE FIBEROPTIC LARYNGOSCOPY performed by Adonis Housekeeper, MD at [facility]   ???  PR THORCOM [facility] W/MEDSTNL & REGIONAL LMPHADEC N/A 08/06/2014    CENTRAL NECK DISSECTION performed by Adonis Housekeeper, MD at [facility]   ??? PERCUTANEOUS CORONARY INTERVENTION N/A 09/03/2015    Possible Percutaneous Coronary Intervention performed by Harley Alto, MD at [facility]         Social History     Social History   Substance Use Topics   ??? Smoking status: Former Smoker     Packs/day: 3.50     Years: 20.00     Types: Cigarettes     Quit date: 04/16/1982   ??? Smokeless tobacco: Never Used   ??? Alcohol use 3.6 - 7.2 oz/week 6 - 12 Cans of beer per week         Additional Comments:  None        Sedation Assessment:   Positive for: Smoker former, 6-12 cans of beer weekly

## 2017-02-06 ENCOUNTER — Emergency Department: Admit: 2017-02-06 | Discharge: 2017-02-06 | Disposition: A | Discharge status: Home / Self Care | Payer: MEDICARE

## 2017-02-06 ENCOUNTER — Encounter: Admit: 2017-02-06 | Discharge: 2017-02-06 | Payer: MEDICARE

## 2017-02-06 ENCOUNTER — Ambulatory Visit: Admit: 2017-02-06 | Discharge: 2017-02-06 | Payer: MEDICARE

## 2017-02-06 ENCOUNTER — Emergency Department: Admit: 2017-02-06 | Discharge: 2017-02-06 | Payer: MEDICARE

## 2017-02-06 DIAGNOSIS — Z959 Presence of cardiac and vascular implant and graft, unspecified: Principal | ICD-10-CM

## 2017-02-06 DIAGNOSIS — R42 Dizziness and giddiness: Principal | ICD-10-CM

## 2017-02-06 DIAGNOSIS — R55 Syncope and collapse: ICD-10-CM

## 2017-02-06 LAB — COMPREHENSIVE METABOLIC PANEL
Lab: 0.6 mg/dL (ref 0.3–1.2)
Lab: 106 MMOL/L — ABNORMAL LOW (ref 98–110)
Lab: 12 U/L (ref 7–56)
Lab: 141 MMOL/L — ABNORMAL LOW (ref 137–147)
Lab: 15 U/L (ref 7–40)
Lab: 27 MMOL/L (ref 21–30)
Lab: 4.1 MMOL/L — ABNORMAL LOW (ref 3.5–5.1)
Lab: 4.2 g/dL (ref 3.5–5.0)
Lab: 6.8 g/dL (ref 6.0–8.0)
Lab: 65 U/L — ABNORMAL LOW (ref 25–110)
Lab: 8 K/UL (ref 3–12)
Lab: 9.2 mg/dL (ref 8.5–10.6)
Lab: >60 mL/min (ref >60)
Lab: >60 mL/min — ABNORMAL LOW (ref >60)

## 2017-02-06 LAB — URINALYSIS DIPSTICK: Lab: NEGATIVE

## 2017-02-06 LAB — MAGNESIUM: Lab: 2.1 mg/dL (ref 1.6–2.6)

## 2017-02-06 LAB — CBC AND DIFF
Lab: 0 10*3/uL (ref 0–0.20)
Lab: 0.1 10*3/uL (ref 0–0.45)
Lab: 4.3 10*3/uL — ABNORMAL LOW (ref 4.5–11.0)

## 2017-02-06 LAB — POC TROPONIN
Lab: 0 ng/mL (ref 0.00–0.05)
Lab: 0 ng/mL (ref 0.00–0.05)

## 2017-02-06 LAB — URINALYSIS, MICROSCOPIC

## 2017-02-06 LAB — PROTIME INR (PT): Lab: 2.8 mg/dL — ABNORMAL HIGH (ref 0.8–1.2)

## 2017-02-06 LAB — POC GLUCOSE: Lab: 98 mg/dL (ref 70–100)

## 2017-02-06 LAB — PTT (APTT): Lab: 42 s — ABNORMAL HIGH (ref 20.0–36.0)

## 2017-02-06 NOTE — ED Notes
Pt comes to the ED after a near syncopal episode at approximately 0930. Pt was getting out of his car to go to his eye Dr. [name] became weak and almost fell but was caught by his wife and leaned on a car. Period of weakness lasted for about two minutes. On arrival to ED pt denies any weakness, lightheadedness, pain, or other symptoms.    Shirt kept at bedside. Pants, shoes, wallet with ID/cards, phone, glasses remain on and with pt. Pt does not wish to store belongings at this time.

## 2017-02-06 NOTE — ED Notes
Pt verbalizes understanding of discharge instructions. AAOx4. Ambulates out of ED with a steady gait. All belongings with patient at time of discharge. '

## 2017-02-06 NOTE — Telephone Encounter
-----   Message from Kenneth Meckel, LPN sent at 94/50/3888 10:10 AM CDT -----  Regarding: LDB- episode this am  VM from wife on triage line at 9:46am.  Michela Pitcher that he had episode this am where he lost his balance, fell and then had trouble walking after that.  He is going to have shot in his eye at 9:50am today.  She wanted [place] to check his remote to see what is going on and call him back on cell # [phone].

## 2017-02-06 NOTE — Telephone Encounter
Returned call from wife regarding dizziness episode today.  Pt awoke this morning feeling OK.  Took am meds as usual.  While walking into Eye dr office felt dizzy.  Wife needed to stablize him so that he did not fall.  Pt denied any other symptoms - palpitations, chest pain, diaphoresis.Marland Kitchen  Episode subsided and currently pt is sitting in drs office lobby and feels OK but little off.  Pt does not have his symptom activator with him.  Instructed pt to discuss his symptoms with his provider and have his BP checked and when he gets home to send in a transmission.  Will alert the ILR team to watch for transmission.

## 2017-02-14 ENCOUNTER — Encounter: Admit: 2017-02-14 | Discharge: 2017-02-14 | Payer: MEDICARE

## 2017-03-07 ENCOUNTER — Ambulatory Visit: Admit: 2017-03-07 | Discharge: 2017-03-08 | Payer: MEDICARE

## 2017-03-08 DIAGNOSIS — Z959 Presence of cardiac and vascular implant and graft, unspecified: Principal | ICD-10-CM

## 2017-03-14 ENCOUNTER — Encounter: Admit: 2017-03-14 | Discharge: 2017-03-14 | Payer: MEDICARE

## 2017-03-14 DIAGNOSIS — I480 Paroxysmal atrial fibrillation: Principal | ICD-10-CM

## 2017-03-16 ENCOUNTER — Ambulatory Visit: Admit: 2017-03-16 | Discharge: 2017-03-16 | Payer: MEDICARE

## 2017-03-16 ENCOUNTER — Encounter: Admit: 2017-03-16 | Discharge: 2017-03-16 | Payer: MEDICARE

## 2017-03-16 ENCOUNTER — Ambulatory Visit: Admit: 2017-03-16 | Discharge: 2017-03-17 | Payer: MEDICARE

## 2017-03-16 DIAGNOSIS — Z79899 Other long term (current) drug therapy: Principal | ICD-10-CM

## 2017-03-16 DIAGNOSIS — E7849 Other hyperlipidemia: ICD-10-CM

## 2017-03-16 DIAGNOSIS — H9190 Unspecified hearing loss, unspecified ear: ICD-10-CM

## 2017-03-16 DIAGNOSIS — E785 Hyperlipidemia, unspecified: Principal | ICD-10-CM

## 2017-03-16 DIAGNOSIS — H5440 Blindness, one eye, unspecified eye: ICD-10-CM

## 2017-03-16 DIAGNOSIS — I10 Essential (primary) hypertension: ICD-10-CM

## 2017-03-16 DIAGNOSIS — I481 Persistent atrial fibrillation: ICD-10-CM

## 2017-03-16 DIAGNOSIS — K219 Gastro-esophageal reflux disease without esophagitis: ICD-10-CM

## 2017-03-16 DIAGNOSIS — I4891 Unspecified atrial fibrillation: ICD-10-CM

## 2017-03-16 DIAGNOSIS — G2581 Restless legs syndrome: ICD-10-CM

## 2017-03-16 DIAGNOSIS — I4819 Other persistent atrial fibrillation: ICD-10-CM

## 2017-03-16 DIAGNOSIS — C73 Malignant neoplasm of thyroid gland: ICD-10-CM

## 2017-03-16 DIAGNOSIS — I2510 Atherosclerotic heart disease of native coronary artery without angina pectoris: ICD-10-CM

## 2017-03-16 DIAGNOSIS — M1990 Unspecified osteoarthritis, unspecified site: ICD-10-CM

## 2017-03-16 DIAGNOSIS — I480 Paroxysmal atrial fibrillation: ICD-10-CM

## 2017-03-16 DIAGNOSIS — C4491 Basal cell carcinoma of skin, unspecified: ICD-10-CM

## 2017-03-16 LAB — LIPID PROFILE
Lab: 125 mg/dL (ref <150)
Lab: 180 mg/dL (ref <200)
Lab: 46 mg/dL (ref >40)

## 2017-03-16 NOTE — Progress Notes
Date of Service: 03/16/2017    Kenneth Buchanan is a 76 y.o. male.       HPI       HPI  Kenneth Buchanan is a 76 year old male with a prior history of persistent atrial fibrillation CHADSVASC score of 4, (age >75 yrs, CAD, HTN), hypertension, hyperlipidemia, two-vessel bypass grafting in 2003 and coronary angioplasty most recently in May  of 2017.  He is in the office today for reevaluation for following his recent cardioversion. He is accompanied by his wife.      He had previously been on amiodarone 1200 mg a week, this was increased to 1800 mg in July when he appeared to be intermittently in atrial fibrillation.  However when he was seen by Dr. [name]  in early October, review of his Linq recorder demonstrated persistent atrial fibrillation.  Arrangements were made for cardioversion at that time.  The patient underwent a successful electrocardioversion on 02/03/2017.    He was Dr. [name] was first diagnosed back in November 2014.  He was on amiodarone 200 mg daily for a year and then cut to 100 mg daily. In December 2016 he came back for routine followup.  He was in AFib and the amiodarone was stopped.        In June 2017 it was thought that he was not doing nearly as well as he had in sinus and he was admitted for TEE/cardioversion.  He ended up with a big hematoma of the uvula.  In addition, after 1 dose of Tikosyn he had impressive QT prolongation.      Ultimately, Dr. [name] put him back on amiodarone.  He was cardioverted and he had maintained sinus for just about 1 year on the 1200 mg per week dose.     Patient states that he feels he has more energy since his recent cardioversion.Pt denies any chest discomfort, shortness of breath, palpitations, heart racing episodes lightheadedness, dizziness, near syncope or syncope, PND or orthopnea. Denies melana, hematachezia, hematuria or epistaxis on anticoagulation.  He states he takes his Xarelto faithfully and has not missed any doses.           Vitals: 03/16/17 1251   BP: 130/82   Pulse: 52   Weight: 93 kg (205 lb)   Height: 1.88 m (6' 2)     Body mass index is 26.32 kg/m???.     Past Medical History  Patient Active Problem List    Diagnosis Date Noted   ??? On continuous oral anticoagulation 10/07/2015   ??? Hematoma of Uvula post TEE 10/05/2015     09/2015-on Triple anticoagulation     ??? Long term current use of amiodarone 09/29/2015     11/14: Initial (Pers) AF, amio 200/d-Dr Hannen  11/15 Decrease to 100/d  12/16 Pers AF-DC amio  7/17 Amio 200/d  3/18 Amio reduced to 1200/week     ??? 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 Persistent atrial fibrillation (HCC) 09/03/2015   ??? CAD S/P percutaneous coronary angioplasty 09/03/2015   ??? 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 Holter monitor, abnormal 08/31/2015   ??? Chronic fatigue 07/27/2015   ??? At risk for stroke 03/24/2015   ??? 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 Neck mass 02/20/2014   ??? PAF (paroxysmal atrial fibrillation) (HCC) 02/07/2013     02/21/13 TEE: EF 50%. No evidence of intracardiac thrombus.  02/21/13 DCCV: Successful DC cardioversion of Atrial fibrillation to sinus rhythm.   08/05/15 ZioPatch: Underlying rhythm was atrial fibrillation 100% of the time. Average heart rate is 68 bpm, patient is not on a beta-blocker. Episodes of tachycardia up to 201 bpm were noted. One 3 second pause occurred. Isolated ventricular tachycardia occurred, the longest episode lasted 5.2 seconds.??? In addition isolated ventricular ectopy was present and trigeminy.     ??? Essential hypertension 07/14/2011   ??? 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 Coronary artery disease involving coronary bypass graft of native heart without angina pectoris 01/05/2009     Coronary artery disease, status post percutaneous coronary intervention of saphenous vein graft to right coronary artery on 10/26/2007 at [facility].  Personal history of premature coronary artery disease -- The patient underwent his      first percutaneous coronary intervention at age 67  08/03/15 Echo: EF 60%. Mild MR. PAP 20 mmHg.   08/05/15 Bruce thallium: This study is abnormal due to calculated lower left ventricular ejection fraction of 42%. This is likely due to the patient's rapid ventricular rates noted with exercise stress. His peak heart rate achieved was 204 beats per minute which was 140% predicted heart rate for age. This is due to rapid ventricular response with atrial fibrillation. There is no definite significant inducible ischemia present. The patient did have dyspnea with physical activity and he was able to exercise for a maximal duration of 7 minutes 24 seconds. ???  09/03/15 LHC: Hemodynamically significant in-stent restenotic lesion in previously placed BMS to venous graft to RCA. Strongly positive fractional flow reserve assessment with reduction in FFR value from 0.94 to 0.72 within 45 seconds. Successful PCI of in-stent restenotic lesion in SVG to RCA with [id number] Herculink Elite stent with excellent angiographic results.      ??? Hyperlipidemia 01/05/2009     Hyperlipidemia, on Crestor -- LDL is almost at goal.     ??? Restless leg syndrome 01/05/2009   ??? Family history of early CAD 01/05/2009     Family history of premature coronary artery disease -- The patient's brother died of      sudden cardiac death and another brother had coronary artery bypass graft at age 21.           Review of Systems   Constitution: Negative.   HENT: Negative.    Eyes: Negative.    Cardiovascular: Positive for irregular heartbeat and palpitations.   Respiratory: Negative.    Endocrine: Negative.    Hematologic/Lymphatic: Negative.    Skin: Negative.    Musculoskeletal: Negative.    Gastrointestinal: Negative.    Genitourinary: Negative.    Neurological: Negative. Psychiatric/Behavioral: Negative.    Allergic/Immunologic: Negative.        Physical Exam  Constitutional/General appearance: Well-developed well-nourished, pleasant gentleman appearing younger than his stated age, no acute distress. Resting comfortably.  Skin/Integumentary: Warm and dry.   Eyes: PERRL, sclera are non-icteric.   ENT. Hearing intact, moist mucous membranes.  Resp/Pulmonary/Chest: Normal respiratory effort without distress or use of accessory muscles. No obvious tracheal deviation. Lung sounds clear to auscultation bilaterally.   Cardiovascular: No JVD noted above sternal notch. Carotids 2+/4+ and equal bilaterally without obvious bruits. Heart sounds demonstrate regular rate and rhythm. S1 and S2 noted. No murmur appreciated. No rubs noted.   GI: Abdomen soft, non-distended, non-tender with active  bowel sounds throughout.   Muscular/Skeletal/Extremities: No pretibial, ankle or pedal edema noted bilaterally. DP and pedal pulses 2+ bilaterally.   Neuro: Alert and oriented x3, conversant.   Psych: Does not appear anxious or depressed. Exhibits appropriate speech and judgement.  Gait: Ambulates to the exam table without difficulty or assistance.       Cardiovascular Studies  12 lead EKG in the office today demonstrates sinus bradycardia with a ventricular rate of 52, QRSd of 88 ms, QT of 512 ms, and QTc of 477 ms.  (Amnio effect)     ILR remote this am showed evidence of atrial fibrillation since 10/18.     Problems Addressed Today  Encounter Diagnoses   Name Primary?   ??? Long term current use of amiodarone Yes   ??? On continuous oral anticoagulation    ??? Hyperlipidemia, unspecified hyperlipidemia type    ??? Essential hypertension    ??? Persistent atrial fibrillation (HCC)        Assessment and Plan     Persistent atrial fibrillation    We will continue amiodarone 1800 mg weekly.  We will also get a lipid profile today  His last LDL was 125 on 40 mg of Lipitor daily.  He will follow-up in 6 months Current Medications (including today's revisions)  ??? acetaminophen (TYLENOL) 500 mg tablet Take 1,000 mg by mouth as Needed for Pain.   ??? amiodarone (CORDARONE) 200 mg tablet Take 1.5 tablets by mouth daily. (Patient taking differently: Take 300 mg by mouth daily. 6 days per week, skip Sundays)   ??? aspirin EC 81 mg tablet Take 1 Tab by mouth daily. Take with food.   ??? atorvastatin (LIPITOR) 80 mg tablet Take 1 tablet by mouth daily. (Patient taking differently: Take 40 mg by mouth daily.)   ??? levothyroxine (SYNTHROID) 137 mcg tablet Take one hundred thirty seven mcg by mouth daily 30 minutes before breakfast.   ??? nitroglycerin (NITROSTAT) 0.4 mg tablet Place 1 tablet under tongue every 5 minutes as needed for Chest Pain. Max of 3 tablets, call 911.   ??? rivaroxaban (XARELTO) 20 mg tablet Take one tablet by mouth daily with dinner. Take with food.   ??? timolol XE(+) (TIMOPTIC-XR) 0.5 % ophthalmic gel Place 1 drop into or around eye(s) daily.   ??? vitamins, multiple tablet Take 1 Tab by mouth daily.

## 2017-03-31 ENCOUNTER — Ambulatory Visit: Admit: 2017-03-31 | Discharge: 2017-03-31 | Payer: MEDICARE

## 2017-03-31 ENCOUNTER — Encounter: Admit: 2017-03-31 | Discharge: 2017-03-31 | Payer: MEDICARE

## 2017-03-31 ENCOUNTER — Ambulatory Visit: Admit: 2017-03-31 | Discharge: 2017-04-01 | Payer: MEDICARE

## 2017-03-31 DIAGNOSIS — C73 Malignant neoplasm of thyroid gland: Principal | ICD-10-CM

## 2017-03-31 DIAGNOSIS — I4891 Unspecified atrial fibrillation: ICD-10-CM

## 2017-03-31 DIAGNOSIS — E785 Hyperlipidemia, unspecified: Principal | ICD-10-CM

## 2017-03-31 DIAGNOSIS — I2510 Atherosclerotic heart disease of native coronary artery without angina pectoris: Secondary | ICD-10-CM

## 2017-03-31 DIAGNOSIS — M1990 Unspecified osteoarthritis, unspecified site: ICD-10-CM

## 2017-03-31 DIAGNOSIS — G2581 Restless legs syndrome: ICD-10-CM

## 2017-03-31 DIAGNOSIS — I10 Essential (primary) hypertension: ICD-10-CM

## 2017-03-31 DIAGNOSIS — I4819 Other persistent atrial fibrillation: ICD-10-CM

## 2017-03-31 DIAGNOSIS — I480 Paroxysmal atrial fibrillation: ICD-10-CM

## 2017-03-31 DIAGNOSIS — E890 Postprocedural hypothyroidism: Principal | ICD-10-CM

## 2017-03-31 DIAGNOSIS — K219 Gastro-esophageal reflux disease without esophagitis: ICD-10-CM

## 2017-03-31 DIAGNOSIS — H5440 Blindness, one eye, unspecified eye: ICD-10-CM

## 2017-03-31 DIAGNOSIS — E7849 Other hyperlipidemia: ICD-10-CM

## 2017-03-31 DIAGNOSIS — H9190 Unspecified hearing loss, unspecified ear: ICD-10-CM

## 2017-03-31 DIAGNOSIS — C4491 Basal cell carcinoma of skin, unspecified: ICD-10-CM

## 2017-03-31 LAB — LIPID PROFILE
Lab: 101 mg/dL — ABNORMAL HIGH (ref <100)
Lab: 121 mg/dL
Lab: 159 mg/dL — ABNORMAL HIGH (ref <150)
Lab: 163 mg/dL (ref <200)
Lab: 42 mg/dL (ref >40)

## 2017-03-31 LAB — THYROID STIMULATING HORMONE-TSH: Lab: 3.4 uU/mL (ref 0.35–5.00)

## 2017-03-31 LAB — FREE T4 (FREE THYROXINE) ONLY: Lab: 1.3 ng/dL (ref 0.6–1.6)

## 2017-03-31 LAB — TSH WITH FREE T4 REFLEX: Lab: 3.4 uU/mL (ref 0.35–5.00)

## 2017-03-31 MED ORDER — LEVOTHYROXINE 150 MCG PO TAB
150 ug | ORAL_TABLET | Freq: Every day | ORAL | 0 refills | 30.00000 days | Status: AC
Start: 2017-03-31 — End: 2017-06-19

## 2017-03-31 NOTE — Progress Notes
Date of Service: 03/31/2017     Subjective:             Kenneth Buchanan is a 76 y.o. male.    History of Present Illness     Mr. Kenneth Buchanan is a 76 year old male who was seen today as a follow up visit for  papillary thyroid cancer. He was last seen on 01/14/16.  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  None of the 10 lymph nodes from central neck dissection were cancerous. His postoperative thyroglobulin was 0.8 and antibody less than 3 on November 25, 2014.  Unfortunately, there was no TSH at that time. The dose of levothyroxine was increased to 137 mcg daily since last visit with most recent TSH was 3.47 on 03/31/17 with thyroglobulin on 0.3. Most recent head and neck ultrasound 12/06/16 showed 5 mm elongated benign appearing nodule on left thyroid bed.  He takes Lt4 regularly in the morning on empty stomach.  He has been feeling well since cardioversion. He denied any loose stool, shakiness, or palpitation. He has a history of CAD and paroxysmal atrial fibrillation. He has been on amiodarone in the past four years. His weight has been fairly stable.   He denied history of head and neck radiation exposure but he retired from [employer] radiation in which he was exposed to [employer].     Family History:  Denied family history of thyroid cancer.  Mother had coronary artery disease and passed away at age 3.  Denied any head or thyroid cancer in the family.    SH:  He quit smoking in 1983 for three packs per day for 23 years.  He is married.  He consumes eight beers per week.     Past Medical History:   Diagnosis Date   ??? Arthritis    ??? Atrial fibrillation (HCC)     s/p cardioversion 2014   ??? Basal cell carcinoma    ??? CAD (coronary artery disease) 1992 ??? CAD (coronary artery disease), native coronary artery 2009    Coronary artery disease, status post percutaneous coronary intervention of saphenous     vein graft to right coronary artery on 10/26/2007 at [facility]. Personal history of premature coronary artery disease -- The patient underwent his     first percutaneous coronary intervention at age 68.    ??? Essential hypertension 07/14/2011   ??? GERD (gastroesophageal reflux disease)     GERD   ??? Hearing loss    ??? Hyperlipidemia    ??? 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     ??? PAF (paroxysmal atrial fibrillation) (HCC) 02/07/2013   ??? Persistent atrial fibrillation (HCC) 08/2015   ??? Restless leg syndrome    ??? Thyroid cancer (HCC)             Review of Systems  Constitutional:   Fatigue: no  Pain: 0/10  Skin:   Heat or cold intolerance : no  Dry skin : no  Flushing episodes : no  Hair loss : no  Excessive body or facial hair: no  Head   Headaches: no  Double or blurry vision : no  Difficulty swallowing or choking : no  Feeling of food stuck: no  Neck enlargement or lumps : no  Head or chest radiation exposure : no  Decreased hearing : no  Chest/Heart   Nipple discharge : no  Chest pain or pressure: no  Heartburn: no  Shortness of breath: no  Heart racing or pounding : yes  Digestion   Abdominal or belly pain : no  Constipation : no  Diarrhea : no  Nausea and vomiting: no  Reflux : no  Lactose intollerance : no  Reproduction   Decreased interest in sex: yes  Urinary   Kidney stones: no   Night time urination : no  Skeleton   Muscle cramping: yes  Glands   Weight loss : no  Weight gain : no  Nerves   Nervous or anxious : yes  Seizures or falls : no  Dizziness : no  numbenss or tingling : no  Mental Health   Do you feel sad? : yes  Sleep problems : yes    Objective:         ??? acetaminophen (TYLENOL) 500 mg tablet Take 1,000 mg by mouth as Needed for Pain. ??? amiodarone (CORDARONE) 200 mg tablet Take 1.5 tablets by mouth daily. (Patient taking differently: Take 300 mg by mouth daily. 6 days per week, skip Sundays)   ??? aspirin EC 81 mg tablet Take 1 Tab by mouth daily. Take with food.   ??? atorvastatin (LIPITOR) 80 mg tablet Take 1 tablet by mouth daily. (Patient taking differently: Take 40 mg by mouth daily.)   ??? levothyroxine (SYNTHROID) 137 mcg tablet Take one hundred thirty seven mcg by mouth daily 30 minutes before breakfast.   ??? nitroglycerin (NITROSTAT) 0.4 mg tablet Place 1 tablet under tongue every 5 minutes as needed for Chest Pain. Max of 3 tablets, call 911.   ??? rivaroxaban (XARELTO) 20 mg tablet Take one tablet by mouth daily with dinner. Take with food.   ??? timolol XE(+) (TIMOPTIC-XR) 0.5 % ophthalmic gel Place 1 drop into or around eye(s) daily.   ??? vitamins, multiple tablet Take 1 Tab by mouth daily.     Vitals:    03/31/17 1509   BP: 137/83   Pulse: 61   Weight: 95.4 kg (210 lb 6.4 oz)   Height: 188 cm (74.02)     Body mass index is 27 kg/m???.     Physical Exam  General Appearance: moderately overweight, no distress   Skin: warm, no ulcers or xanthomas;  Digits and Nails: no cyanosis or clubbing    Thyroid:heal scar, no masses, tenderness or enlargement   Respiratory Effort: breathing comfortably, no respiratory distress   Auscultation/Percussion: lungs clear to auscultation, no rales or rhonchi, no wheezing   Cardiac Rhythm: slow irregular heart rate  Cardiac Auscultation: S1, S2 normal, no rub, no gallop   Peripheral Circulation: normal peripheral circulation   Lower Extremity Edema: no lower extremity edema   Abdominal Exam: mildly protuberant but soft, non-tender, no masses, no purple striae  Gait & Station: walks without assistance   Muscle Strength: normal muscle tone   Orientation: oriented to time, place and person   Affect & Mood: appropriate and sustained affect   Language and Memory: patient responsive and seems to comprehend information Neurologic Exam: neurological assessment grossly intact   Other: moves all extremities, No tremor        Results for TYDEN, KANN (MRN [id number]) as of 04/01/2017 12:31   Ref. 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  He also has microcarcinoma over left lobe of thyroid with no cervical lymph node involvement.  His postop ultrasound from August of 2016 showed no evidence of recurrence as well as his thyroglobulin was appropriately suppressed to 0.8.  Radioactive iodine was not indicated given low risk tumor characteristic and negative thyroglobulin level and imaging study postop.  His goal TSH should be in around 0.5-2  but due to history of atrial fibrillation, TSH does not need to be suppressed. Most recent TSH was 3.47 on 03/31/17 with thyroglobulin on 0.3. Plan to increase dose to 150 mcg daily and recheck in 6 weeks.  Most recent head and neck ultrasound 12/06/16 showed 5 mm elongated benign appearing nodule on left thyroid bed. Plan to repeat ultrasound in 8/19 and thyroglobulin at that time.    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     Return to clinic in 8 months.

## 2017-04-01 ENCOUNTER — Encounter: Admit: 2017-04-01 | Discharge: 2017-04-01 | Payer: MEDICARE

## 2017-04-01 DIAGNOSIS — C73 Malignant neoplasm of thyroid gland: ICD-10-CM

## 2017-04-01 DIAGNOSIS — E785 Hyperlipidemia, unspecified: Principal | ICD-10-CM

## 2017-04-01 DIAGNOSIS — K219 Gastro-esophageal reflux disease without esophagitis: ICD-10-CM

## 2017-04-01 DIAGNOSIS — C4491 Basal cell carcinoma of skin, unspecified: ICD-10-CM

## 2017-04-01 DIAGNOSIS — H9190 Unspecified hearing loss, unspecified ear: ICD-10-CM

## 2017-04-01 DIAGNOSIS — I4891 Unspecified atrial fibrillation: ICD-10-CM

## 2017-04-01 DIAGNOSIS — I10 Essential (primary) hypertension: ICD-10-CM

## 2017-04-01 DIAGNOSIS — G2581 Restless legs syndrome: ICD-10-CM

## 2017-04-01 DIAGNOSIS — I2510 Atherosclerotic heart disease of native coronary artery without angina pectoris: Secondary | ICD-10-CM

## 2017-04-01 DIAGNOSIS — I480 Paroxysmal atrial fibrillation: ICD-10-CM

## 2017-04-01 DIAGNOSIS — H5440 Blindness, one eye, unspecified eye: ICD-10-CM

## 2017-04-01 DIAGNOSIS — I4819 Other persistent atrial fibrillation: ICD-10-CM

## 2017-04-01 DIAGNOSIS — M1990 Unspecified osteoarthritis, unspecified site: ICD-10-CM

## 2017-04-01 LAB — THYROGLOBULIN & THYROGLOBULIN AB: Lab: 0.3 ng/mL — ABNORMAL LOW (ref 1.7–55.0)

## 2017-04-08 ENCOUNTER — Ambulatory Visit: Admit: 2017-04-07 | Discharge: 2017-04-08 | Payer: MEDICARE

## 2017-04-08 DIAGNOSIS — Z959 Presence of cardiac and vascular implant and graft, unspecified: ICD-10-CM

## 2017-04-08 DIAGNOSIS — I481 Persistent atrial fibrillation: Principal | ICD-10-CM

## 2017-04-13 ENCOUNTER — Encounter: Admit: 2017-04-13 | Discharge: 2017-04-13 | Payer: MEDICARE

## 2017-04-25 ENCOUNTER — Encounter: Admit: 2017-04-25 | Discharge: 2017-04-25 | Payer: MEDICARE

## 2017-04-25 DIAGNOSIS — E782 Mixed hyperlipidemia: Principal | ICD-10-CM

## 2017-05-09 ENCOUNTER — Ambulatory Visit: Admit: 2017-05-09 | Discharge: 2017-05-10 | Payer: MEDICARE

## 2017-05-10 DIAGNOSIS — I481 Persistent atrial fibrillation: Principal | ICD-10-CM

## 2017-05-10 DIAGNOSIS — Z959 Presence of cardiac and vascular implant and graft, unspecified: Secondary | ICD-10-CM

## 2017-06-08 ENCOUNTER — Ambulatory Visit: Admit: 2017-06-08 | Discharge: 2017-06-09 | Payer: MEDICARE

## 2017-06-09 DIAGNOSIS — I481 Persistent atrial fibrillation: Principal | ICD-10-CM

## 2017-06-09 DIAGNOSIS — Z959 Presence of cardiac and vascular implant and graft, unspecified: ICD-10-CM

## 2017-06-19 ENCOUNTER — Encounter: Admit: 2017-06-19 | Discharge: 2017-06-19 | Payer: MEDICARE

## 2017-06-19 DIAGNOSIS — E890 Postprocedural hypothyroidism: Principal | ICD-10-CM

## 2017-06-19 MED ORDER — LEVOTHYROXINE 150 MCG PO TAB
150 ug | ORAL_TABLET | Freq: Every day | ORAL | 2 refills | 30.00000 days | Status: AC
Start: 2017-06-19 — End: 2018-03-21

## 2017-07-10 ENCOUNTER — Ambulatory Visit: Admit: 2017-07-10 | Discharge: 2017-07-10 | Payer: MEDICARE

## 2017-07-10 DIAGNOSIS — Z959 Presence of cardiac and vascular implant and graft, unspecified: ICD-10-CM

## 2017-07-10 DIAGNOSIS — I481 Persistent atrial fibrillation: Principal | ICD-10-CM

## 2017-07-20 ENCOUNTER — Encounter: Admit: 2017-07-20 | Discharge: 2017-07-20 | Payer: MEDICARE

## 2017-07-20 DIAGNOSIS — I2510 Atherosclerotic heart disease of native coronary artery without angina pectoris: Secondary | ICD-10-CM

## 2017-07-20 DIAGNOSIS — C73 Malignant neoplasm of thyroid gland: ICD-10-CM

## 2017-07-20 DIAGNOSIS — I480 Paroxysmal atrial fibrillation: ICD-10-CM

## 2017-07-20 DIAGNOSIS — H9190 Unspecified hearing loss, unspecified ear: ICD-10-CM

## 2017-07-20 DIAGNOSIS — E785 Hyperlipidemia, unspecified: Principal | ICD-10-CM

## 2017-07-20 DIAGNOSIS — G2581 Restless legs syndrome: ICD-10-CM

## 2017-07-20 DIAGNOSIS — H5440 Blindness, one eye, unspecified eye: ICD-10-CM

## 2017-07-20 DIAGNOSIS — I4819 Other persistent atrial fibrillation: ICD-10-CM

## 2017-07-20 DIAGNOSIS — I10 Essential (primary) hypertension: ICD-10-CM

## 2017-07-20 DIAGNOSIS — I4891 Unspecified atrial fibrillation: ICD-10-CM

## 2017-07-20 DIAGNOSIS — C4491 Basal cell carcinoma of skin, unspecified: ICD-10-CM

## 2017-07-20 DIAGNOSIS — M1990 Unspecified osteoarthritis, unspecified site: ICD-10-CM

## 2017-07-20 DIAGNOSIS — K219 Gastro-esophageal reflux disease without esophagitis: ICD-10-CM

## 2017-07-26 ENCOUNTER — Encounter: Admit: 2017-07-26 | Discharge: 2017-07-26 | Payer: MEDICARE

## 2017-07-27 ENCOUNTER — Encounter: Admit: 2017-07-27 | Discharge: 2017-07-27 | Payer: MEDICARE

## 2017-07-31 LAB — LIPID PROFILE
Lab: 129 — ABNORMAL LOW (ref 150–200)
Lab: 37
Lab: 76
Lab: 81

## 2017-08-07 ENCOUNTER — Encounter: Admit: 2017-08-07 | Discharge: 2017-08-07 | Payer: MEDICARE

## 2017-08-09 ENCOUNTER — Encounter: Admit: 2017-08-09 | Discharge: 2017-08-09 | Payer: MEDICARE

## 2017-08-09 DIAGNOSIS — E782 Mixed hyperlipidemia: Principal | ICD-10-CM

## 2017-08-10 ENCOUNTER — Ambulatory Visit: Admit: 2017-08-10 | Discharge: 2017-08-11 | Payer: MEDICARE

## 2017-08-11 DIAGNOSIS — I481 Persistent atrial fibrillation: Principal | ICD-10-CM

## 2017-08-11 DIAGNOSIS — Z959 Presence of cardiac and vascular implant and graft, unspecified: Secondary | ICD-10-CM

## 2017-08-21 ENCOUNTER — Encounter: Admit: 2017-08-21 | Discharge: 2017-08-21 | Payer: MEDICARE

## 2017-08-21 DIAGNOSIS — E7849 Other hyperlipidemia: Principal | ICD-10-CM

## 2017-08-21 MED ORDER — ATORVASTATIN 80 MG PO TAB
ORAL_TABLET | Freq: Every day | 3 refills | Status: AC
Start: 2017-08-21 — End: 2018-08-10

## 2017-09-12 ENCOUNTER — Ambulatory Visit: Admit: 2017-09-12 | Discharge: 2017-09-13 | Payer: MEDICARE

## 2017-09-13 ENCOUNTER — Encounter: Admit: 2017-09-13 | Discharge: 2017-09-13 | Payer: MEDICARE

## 2017-09-13 DIAGNOSIS — Z959 Presence of cardiac and vascular implant and graft, unspecified: ICD-10-CM

## 2017-09-13 DIAGNOSIS — I481 Persistent atrial fibrillation: Principal | ICD-10-CM

## 2017-10-05 ENCOUNTER — Encounter: Admit: 2017-10-05 | Discharge: 2017-10-05 | Payer: MEDICARE

## 2017-10-11 ENCOUNTER — Ambulatory Visit: Admit: 2017-10-11 | Discharge: 2017-10-12 | Payer: MEDICARE

## 2017-10-12 DIAGNOSIS — Z959 Presence of cardiac and vascular implant and graft, unspecified: Principal | ICD-10-CM

## 2017-11-13 ENCOUNTER — Ambulatory Visit: Admit: 2017-11-13 | Discharge: 2017-11-14 | Payer: MEDICARE

## 2017-11-14 ENCOUNTER — Encounter: Admit: 2017-11-14 | Discharge: 2017-11-14 | Payer: MEDICARE

## 2017-11-14 DIAGNOSIS — Z959 Presence of cardiac and vascular implant and graft, unspecified: Secondary | ICD-10-CM

## 2017-11-14 DIAGNOSIS — I481 Persistent atrial fibrillation: Principal | ICD-10-CM

## 2017-11-15 ENCOUNTER — Encounter: Admit: 2017-11-15 | Discharge: 2017-11-15 | Payer: MEDICARE

## 2017-11-15 DIAGNOSIS — I480 Paroxysmal atrial fibrillation: Principal | ICD-10-CM

## 2017-11-16 ENCOUNTER — Ambulatory Visit: Admit: 2017-11-16 | Discharge: 2017-11-16 | Payer: MEDICARE

## 2017-11-16 ENCOUNTER — Encounter: Admit: 2017-11-16 | Discharge: 2017-11-16 | Payer: MEDICARE

## 2017-11-16 ENCOUNTER — Ambulatory Visit: Admit: 2017-11-16 | Discharge: 2017-11-17 | Payer: MEDICARE

## 2017-11-16 DIAGNOSIS — I25810 Atherosclerosis of coronary artery bypass graft(s) without angina pectoris: Secondary | ICD-10-CM

## 2017-11-16 DIAGNOSIS — K219 Gastro-esophageal reflux disease without esophagitis: ICD-10-CM

## 2017-11-16 DIAGNOSIS — I480 Paroxysmal atrial fibrillation: Principal | ICD-10-CM

## 2017-11-16 DIAGNOSIS — I2510 Atherosclerotic heart disease of native coronary artery without angina pectoris: ICD-10-CM

## 2017-11-16 DIAGNOSIS — I10 Essential (primary) hypertension: ICD-10-CM

## 2017-11-16 DIAGNOSIS — M1990 Unspecified osteoarthritis, unspecified site: ICD-10-CM

## 2017-11-16 DIAGNOSIS — Z95818 Presence of other cardiac implants and grafts: ICD-10-CM

## 2017-11-16 DIAGNOSIS — I481 Persistent atrial fibrillation: ICD-10-CM

## 2017-11-16 DIAGNOSIS — I495 Sick sinus syndrome: ICD-10-CM

## 2017-11-16 DIAGNOSIS — R001 Bradycardia, unspecified: Principal | ICD-10-CM

## 2017-11-16 DIAGNOSIS — Z9861 Coronary angioplasty status: ICD-10-CM

## 2017-11-16 DIAGNOSIS — G2581 Restless legs syndrome: ICD-10-CM

## 2017-11-16 DIAGNOSIS — E785 Hyperlipidemia, unspecified: Principal | ICD-10-CM

## 2017-11-16 DIAGNOSIS — I4891 Unspecified atrial fibrillation: Principal | ICD-10-CM

## 2017-11-16 DIAGNOSIS — H5440 Blindness, one eye, unspecified eye: ICD-10-CM

## 2017-11-16 DIAGNOSIS — Z951 Presence of aortocoronary bypass graft: ICD-10-CM

## 2017-11-16 DIAGNOSIS — C73 Malignant neoplasm of thyroid gland: ICD-10-CM

## 2017-11-16 DIAGNOSIS — C4491 Basal cell carcinoma of skin, unspecified: ICD-10-CM

## 2017-11-16 DIAGNOSIS — I4819 Other persistent atrial fibrillation: ICD-10-CM

## 2017-11-16 DIAGNOSIS — H9190 Unspecified hearing loss, unspecified ear: ICD-10-CM

## 2017-11-16 LAB — CBC
Lab: 15 % — ABNORMAL HIGH (ref >60)
Lab: 230 K/UL (ref >60)
Lab: 31 pg — ABNORMAL HIGH (ref 26–34)
Lab: 33 g/dL (ref 32.0–36.0)
Lab: 39 % — ABNORMAL LOW (ref 40–50)
Lab: 4.1 M/UL — ABNORMAL LOW (ref 4.4–5.5)
Lab: 6.8 K/UL (ref 4.5–11.0)
Lab: 9 FL (ref 7–11)
Lab: 94 FL (ref 80–100)

## 2017-11-16 LAB — MAGNESIUM: Lab: 2.1 mg/dL — ABNORMAL LOW (ref 1.6–2.6)

## 2017-11-16 LAB — BASIC METABOLIC PANEL
Lab: 139 MMOL/L — ABNORMAL HIGH (ref 137–147)
Lab: 3.9 MMOL/L (ref 3.5–5.1)

## 2017-11-16 MED ORDER — CEFAZOLIN INJ 1GM IVP
1 g | INTRAVENOUS | 0 refills | Status: CN
Start: 2017-11-16 — End: ?

## 2017-11-16 MED ORDER — CEFAZOLIN INJ 1GM IVP
2 g | Freq: Once | INTRAVENOUS | 0 refills | Status: CN
Start: 2017-11-16 — End: ?

## 2017-11-16 MED ORDER — SODIUM CHLORIDE 0.9 % IV SOLP
500 mL | INTRAVENOUS | 0 refills | Status: CN
Start: 2017-11-16 — End: ?

## 2017-11-16 MED ORDER — LIDOCAINE (PF) 10 MG/ML (1 %) IJ SOLN
.1-2 mL | INTRAMUSCULAR | 0 refills | Status: CN | PRN
Start: 2017-11-16 — End: ?

## 2017-11-17 DIAGNOSIS — I481 Persistent atrial fibrillation: Secondary | ICD-10-CM

## 2017-11-17 DIAGNOSIS — I495 Sick sinus syndrome: Principal | ICD-10-CM

## 2017-11-20 ENCOUNTER — Encounter: Admit: 2017-11-20 | Discharge: 2017-11-20 | Payer: MEDICARE

## 2017-11-23 ENCOUNTER — Encounter: Admit: 2017-11-23 | Discharge: 2017-11-23 | Payer: MEDICARE

## 2017-11-23 ENCOUNTER — Ambulatory Visit: Admit: 2017-11-23 | Discharge: 2017-11-23 | Payer: MEDICARE

## 2017-11-23 DIAGNOSIS — H5440 Blindness, one eye, unspecified eye: ICD-10-CM

## 2017-11-23 DIAGNOSIS — Q892 Congenital malformations of other endocrine glands: ICD-10-CM

## 2017-11-23 DIAGNOSIS — G2581 Restless legs syndrome: ICD-10-CM

## 2017-11-23 DIAGNOSIS — H9190 Unspecified hearing loss, unspecified ear: ICD-10-CM

## 2017-11-23 DIAGNOSIS — Z9009 Acquired absence of other part of head and neck: ICD-10-CM

## 2017-11-23 DIAGNOSIS — C4491 Basal cell carcinoma of skin, unspecified: ICD-10-CM

## 2017-11-23 DIAGNOSIS — I480 Paroxysmal atrial fibrillation: ICD-10-CM

## 2017-11-23 DIAGNOSIS — I2510 Atherosclerotic heart disease of native coronary artery without angina pectoris: ICD-10-CM

## 2017-11-23 DIAGNOSIS — E785 Hyperlipidemia, unspecified: Principal | ICD-10-CM

## 2017-11-23 DIAGNOSIS — C73 Malignant neoplasm of thyroid gland: Principal | ICD-10-CM

## 2017-11-23 DIAGNOSIS — I4891 Unspecified atrial fibrillation: ICD-10-CM

## 2017-11-23 DIAGNOSIS — M1990 Unspecified osteoarthritis, unspecified site: ICD-10-CM

## 2017-11-23 DIAGNOSIS — I4819 Other persistent atrial fibrillation: ICD-10-CM

## 2017-11-23 DIAGNOSIS — Z9889 Other specified postprocedural states: ICD-10-CM

## 2017-11-23 DIAGNOSIS — K219 Gastro-esophageal reflux disease without esophagitis: ICD-10-CM

## 2017-11-23 DIAGNOSIS — I10 Essential (primary) hypertension: ICD-10-CM

## 2017-11-23 MED ORDER — DOCUSATE SODIUM 100 MG PO CAP
100 mg | Freq: Every day | ORAL | 0 refills | Status: CN | PRN
Start: 2017-11-23 — End: ?

## 2017-11-23 MED ORDER — ALUMINUM-MAGNESIUM HYDROXIDE 200-200 MG/5 ML PO SUSP
30 mL | ORAL | 0 refills | Status: CN | PRN
Start: 2017-11-23 — End: ?

## 2017-11-27 ENCOUNTER — Encounter: Admit: 2017-11-27 | Discharge: 2017-11-27 | Payer: MEDICARE

## 2017-11-28 ENCOUNTER — Encounter: Admit: 2017-11-28 | Discharge: 2017-11-28 | Payer: MEDICARE

## 2017-11-28 ENCOUNTER — Ambulatory Visit: Admit: 2017-11-28 | Discharge: 2017-11-28 | Payer: MEDICARE

## 2017-11-28 DIAGNOSIS — M1990 Unspecified osteoarthritis, unspecified site: ICD-10-CM

## 2017-11-28 DIAGNOSIS — K219 Gastro-esophageal reflux disease without esophagitis: ICD-10-CM

## 2017-11-28 DIAGNOSIS — C73 Malignant neoplasm of thyroid gland: ICD-10-CM

## 2017-11-28 DIAGNOSIS — I480 Paroxysmal atrial fibrillation: ICD-10-CM

## 2017-11-28 DIAGNOSIS — H5440 Blindness, one eye, unspecified eye: ICD-10-CM

## 2017-11-28 DIAGNOSIS — H9190 Unspecified hearing loss, unspecified ear: ICD-10-CM

## 2017-11-28 DIAGNOSIS — I495 Sick sinus syndrome: Principal | ICD-10-CM

## 2017-11-28 DIAGNOSIS — E785 Hyperlipidemia, unspecified: Principal | ICD-10-CM

## 2017-11-28 DIAGNOSIS — I4819 Other persistent atrial fibrillation: ICD-10-CM

## 2017-11-28 DIAGNOSIS — C4491 Basal cell carcinoma of skin, unspecified: ICD-10-CM

## 2017-11-28 DIAGNOSIS — I2510 Atherosclerotic heart disease of native coronary artery without angina pectoris: Secondary | ICD-10-CM

## 2017-11-28 DIAGNOSIS — I10 Essential (primary) hypertension: ICD-10-CM

## 2017-11-28 DIAGNOSIS — G2581 Restless legs syndrome: ICD-10-CM

## 2017-11-28 DIAGNOSIS — I4891 Unspecified atrial fibrillation: ICD-10-CM

## 2017-11-28 MED ORDER — CEFAZOLIN INJ 1GM IVP
2 g | Freq: Once | INTRAVENOUS | 0 refills | Status: CP
Start: 2017-11-28 — End: ?

## 2017-11-28 MED ORDER — RIVAROXABAN 20 MG PO TAB
20 mg | Freq: Every day | ORAL | 0 refills | Status: DC
Start: 2017-11-28 — End: 2017-11-29

## 2017-11-28 MED ORDER — LEVOTHYROXINE 150 MCG PO TAB
150 ug | Freq: Every day | ORAL | 0 refills | Status: DC
Start: 2017-11-28 — End: 2017-11-29
  Administered 2017-11-29: 14:00:00 150 ug via ORAL

## 2017-11-28 MED ORDER — ALUMINUM-MAGNESIUM HYDROXIDE 200-200 MG/5 ML PO SUSP
30 mL | ORAL | 0 refills | Status: DC | PRN
Start: 2017-11-28 — End: 2017-11-29

## 2017-11-28 MED ORDER — ACETAMINOPHEN 325 MG PO TAB
650 mg | ORAL | 0 refills | Status: DC | PRN
Start: 2017-11-28 — End: 2017-11-29
  Administered 2017-11-28: 22:00:00 650 mg via ORAL

## 2017-11-28 MED ORDER — CEFAZOLIN INJ 1GM IVP
1 g | INTRAVENOUS | 0 refills | Status: DC
Start: 2017-11-28 — End: 2017-11-28

## 2017-11-28 MED ORDER — PROPOFOL INJ 10 MG/ML IV VIAL
0 refills | Status: DC
Start: 2017-11-28 — End: 2017-11-28
  Administered 2017-11-28: 20:00:00 30 mg via INTRAVENOUS

## 2017-11-28 MED ORDER — ACETAMINOPHEN 325 MG PO TAB
650 mg | ORAL | 0 refills | Status: DC | PRN
Start: 2017-11-28 — End: 2017-11-28

## 2017-11-28 MED ORDER — IMS MIXTURE TEMPLATE
300 mg | Freq: Every day | ORAL | 0 refills | Status: DC
Start: 2017-11-28 — End: 2017-11-29
  Administered 2017-11-29 (×2): 300 mg via ORAL

## 2017-11-28 MED ORDER — LIDOCAINE (PF) 10 MG/ML (1 %) IJ SOLN
.1-2 mL | INTRAMUSCULAR | 0 refills | Status: DC | PRN
Start: 2017-11-28 — End: 2017-11-28

## 2017-11-28 MED ORDER — HYDROCODONE-ACETAMINOPHEN 5-325 MG PO TAB
1-2 | ORAL | 0 refills | Status: DC | PRN
Start: 2017-11-28 — End: 2017-11-29
  Administered 2017-11-29: 14:00:00 2 via ORAL
  Administered 2017-11-29: 01:00:00 1 via ORAL
  Administered 2017-11-29 (×2): 2 via ORAL

## 2017-11-28 MED ORDER — SODIUM CHLORIDE 0.9 % IV SOLP
500 mL | INTRAVENOUS | 0 refills | Status: DC
Start: 2017-11-28 — End: 2017-11-29
  Administered 2017-11-28: 17:00:00 500 mL via INTRAVENOUS

## 2017-11-28 MED ORDER — ATORVASTATIN 40 MG PO TAB
80 mg | Freq: Every evening | ORAL | 0 refills | Status: DC
Start: 2017-11-28 — End: 2017-11-29
  Administered 2017-11-29: 01:00:00 80 mg via ORAL

## 2017-11-28 MED ORDER — DOCUSATE SODIUM 100 MG PO CAP
100 mg | Freq: Every day | ORAL | 0 refills | Status: DC | PRN
Start: 2017-11-28 — End: 2017-11-29

## 2017-11-28 MED ORDER — CEFAZOLIN INJ 1GM IVP
1 g | INTRAVENOUS | 0 refills | Status: CP
Start: 2017-11-28 — End: ?
  Administered 2017-11-29: 02:00:00 1 g via INTRAVENOUS

## 2017-11-28 MED ORDER — HYDROCODONE-ACETAMINOPHEN 5-325 MG PO TAB
1 | ORAL | 0 refills | Status: DC | PRN
Start: 2017-11-28 — End: 2017-11-29
  Administered 2017-11-28: 1 via ORAL

## 2017-11-28 MED ORDER — ASPIRIN 81 MG PO TBEC
81 mg | Freq: Every day | ORAL | 0 refills | Status: DC
Start: 2017-11-28 — End: 2017-11-29

## 2017-11-29 ENCOUNTER — Encounter: Admit: 2017-11-29 | Discharge: 2017-11-30 | Payer: MEDICARE

## 2017-11-29 ENCOUNTER — Encounter: Admit: 2017-11-28 | Discharge: 2017-11-29 | Payer: MEDICARE

## 2017-11-29 ENCOUNTER — Encounter: Admit: 2017-11-29 | Discharge: 2017-11-29 | Payer: MEDICARE

## 2017-11-29 DIAGNOSIS — I480 Paroxysmal atrial fibrillation: ICD-10-CM

## 2017-11-29 DIAGNOSIS — I481 Persistent atrial fibrillation: ICD-10-CM

## 2017-11-29 DIAGNOSIS — I495 Sick sinus syndrome: ICD-10-CM

## 2017-11-29 DIAGNOSIS — I4891 Unspecified atrial fibrillation: Principal | ICD-10-CM

## 2017-11-29 DIAGNOSIS — Z959 Presence of cardiac and vascular implant and graft, unspecified: ICD-10-CM

## 2017-11-29 LAB — BASIC METABOLIC PANEL
Lab: 0.8 mg/dL (ref 0.4–1.24)
Lab: 105 MMOL/L — ABNORMAL LOW (ref 98–110)
Lab: 120 mg/dL — ABNORMAL HIGH (ref 70–100)
Lab: 138 MMOL/L — ABNORMAL LOW (ref 137–147)
Lab: 14 mg/dL (ref 7–25)
Lab: 25 MMOL/L (ref 21–30)
Lab: 3.8 MMOL/L — ABNORMAL LOW (ref 3.5–5.1)
Lab: 8 pg (ref 3–12)
Lab: 8.4 mg/dL — ABNORMAL LOW (ref 8.5–10.6)
Lab: >60 mL/min (ref >60)

## 2017-11-29 LAB — CBC: Lab: 6 10*3/uL (ref 4.5–11.0)

## 2017-11-29 MED ORDER — HYDROCODONE-ACETAMINOPHEN 5-325 MG PO TAB
1 | ORAL_TABLET | ORAL | 0 refills | 15.00000 days | Status: AC | PRN
Start: 2017-11-29 — End: 2018-06-13

## 2017-11-29 MED ORDER — HYDROCODONE-ACETAMINOPHEN 5-325 MG PO TAB
1-2 | ORAL_TABLET | ORAL | 0 refills | 15.00000 days | Status: AC | PRN
Start: 2017-11-29 — End: 2018-06-13

## 2017-11-29 MED ORDER — RIVAROXABAN 20 MG PO TAB
20 mg | Freq: Every day | ORAL | 0 refills | Status: DC
Start: 2017-11-29 — End: 2017-11-29
  Administered 2017-11-29: 15:00:00 20 mg via ORAL

## 2017-11-30 ENCOUNTER — Encounter: Admit: 2017-11-30 | Discharge: 2017-11-30 | Payer: MEDICARE

## 2017-12-01 ENCOUNTER — Ambulatory Visit: Admit: 2017-11-30 | Discharge: 2017-12-01 | Payer: MEDICARE

## 2017-12-01 ENCOUNTER — Encounter: Admit: 2017-12-01 | Discharge: 2017-12-01 | Payer: MEDICARE

## 2017-12-01 ENCOUNTER — Ambulatory Visit: Admit: 2017-12-01 | Discharge: 2017-12-02 | Payer: MEDICARE

## 2017-12-01 DIAGNOSIS — Z959 Presence of cardiac and vascular implant and graft, unspecified: Secondary | ICD-10-CM

## 2017-12-01 DIAGNOSIS — I480 Paroxysmal atrial fibrillation: ICD-10-CM

## 2017-12-01 DIAGNOSIS — I495 Sick sinus syndrome: Principal | ICD-10-CM

## 2017-12-04 ENCOUNTER — Encounter: Admit: 2017-12-04 | Discharge: 2017-12-04 | Payer: MEDICARE

## 2017-12-04 DIAGNOSIS — I480 Paroxysmal atrial fibrillation: Principal | ICD-10-CM

## 2017-12-04 DIAGNOSIS — I495 Sick sinus syndrome: ICD-10-CM

## 2017-12-04 LAB — CBC
Lab: 197
Lab: 197
Lab: 2.7 — ABNORMAL LOW (ref 4.70–6.10)
Lab: 2.7 — ABNORMAL LOW (ref 4.70–6.10)
Lab: 26 — ABNORMAL LOW (ref 42.0–52.0)
Lab: 6.1
Lab: 6.1 — ABNORMAL LOW (ref 4.70–6.10)
Lab: 8.9 — ABNORMAL LOW (ref 14.0–18.0)

## 2017-12-05 ENCOUNTER — Ambulatory Visit: Admit: 2017-12-05 | Discharge: 2017-12-06 | Payer: MEDICARE

## 2017-12-05 MED ORDER — ASPIRIN 81 MG PO TBEC
81 mg | ORAL_TABLET | Freq: Every day | ORAL | 3 refills | Status: AC
Start: 2017-12-05 — End: ?

## 2017-12-05 MED ORDER — RIVAROXABAN 15 MG PO TAB
15 mg | ORAL_TABLET | Freq: Every day | ORAL | 11 refills | 30.00000 days | Status: AC
Start: 2017-12-05 — End: 2018-11-15

## 2017-12-07 ENCOUNTER — Encounter: Admit: 2017-12-07 | Discharge: 2017-12-07 | Payer: MEDICARE

## 2017-12-07 DIAGNOSIS — Z959 Presence of cardiac and vascular implant and graft, unspecified: Principal | ICD-10-CM

## 2017-12-08 LAB — CBC: Lab: 6.7

## 2017-12-11 ENCOUNTER — Ambulatory Visit: Admit: 2017-12-11 | Discharge: 2017-12-11 | Payer: MEDICARE

## 2017-12-11 ENCOUNTER — Encounter: Admit: 2017-12-11 | Discharge: 2017-12-11 | Payer: MEDICARE

## 2017-12-11 DIAGNOSIS — Z959 Presence of cardiac and vascular implant and graft, unspecified: ICD-10-CM

## 2017-12-11 DIAGNOSIS — I495 Sick sinus syndrome: Principal | ICD-10-CM

## 2017-12-12 ENCOUNTER — Encounter: Admit: 2017-12-12 | Discharge: 2017-12-12 | Payer: MEDICARE

## 2017-12-12 DIAGNOSIS — I480 Paroxysmal atrial fibrillation: Principal | ICD-10-CM

## 2017-12-14 ENCOUNTER — Encounter: Admit: 2017-12-14 | Discharge: 2017-12-14 | Payer: MEDICARE

## 2017-12-15 MED ORDER — CEPHALEXIN 500 MG PO CAP
500 mg | ORAL_CAPSULE | Freq: Four times a day (QID) | ORAL | 0 refills | Status: AC
Start: 2017-12-15 — End: ?

## 2017-12-21 ENCOUNTER — Encounter: Admit: 2017-12-21 | Discharge: 2017-12-21 | Payer: MEDICARE

## 2017-12-28 ENCOUNTER — Encounter: Admit: 2017-12-28 | Discharge: 2017-12-28 | Payer: MEDICARE

## 2018-01-01 ENCOUNTER — Encounter: Admit: 2018-01-01 | Discharge: 2018-01-01 | Payer: MEDICARE

## 2018-01-18 ENCOUNTER — Encounter: Admit: 2018-01-18 | Discharge: 2018-01-18 | Payer: MEDICARE

## 2018-01-19 ENCOUNTER — Encounter: Admit: 2018-01-19 | Discharge: 2018-01-19 | Payer: MEDICARE

## 2018-01-30 LAB — BASIC METABOLIC PANEL
Lab: 0.9 mg/dL — ABNORMAL HIGH (ref 8.5–10.6)
Lab: 106 mg/dL — ABNORMAL HIGH (ref 70–100)
Lab: 12 U/L — ABNORMAL HIGH (ref 7–40)
Lab: 14 mg/dL — ABNORMAL HIGH (ref 0.4–1.00)
Lab: 143 MMOL/L — ABNORMAL LOW (ref 3.5–5.1)
Lab: 29 mg/dL — ABNORMAL HIGH (ref 7–25)
Lab: 3.8 MMOL/L — ABNORMAL LOW (ref 98–110)
Lab: 8.6 mg/dL (ref 0.3–1.2)
Lab: 97 g/dL (ref 6.0–8.0)

## 2018-01-31 ENCOUNTER — Encounter: Admit: 2018-01-31 | Discharge: 2018-01-31 | Payer: MEDICARE

## 2018-01-31 ENCOUNTER — Encounter: Admit: 2018-01-31 | Discharge: 2018-02-01 | Payer: MEDICARE

## 2018-01-31 DIAGNOSIS — I480 Paroxysmal atrial fibrillation: Principal | ICD-10-CM

## 2018-02-08 ENCOUNTER — Encounter: Admit: 2018-02-08 | Discharge: 2018-02-08 | Payer: MEDICARE

## 2018-02-08 DIAGNOSIS — G2581 Restless legs syndrome: ICD-10-CM

## 2018-02-08 DIAGNOSIS — I2510 Atherosclerotic heart disease of native coronary artery without angina pectoris: ICD-10-CM

## 2018-02-08 DIAGNOSIS — Z950 Presence of cardiac pacemaker: ICD-10-CM

## 2018-02-08 DIAGNOSIS — I4891 Unspecified atrial fibrillation: ICD-10-CM

## 2018-02-08 DIAGNOSIS — I4819 Other persistent atrial fibrillation: ICD-10-CM

## 2018-02-08 DIAGNOSIS — I480 Paroxysmal atrial fibrillation: ICD-10-CM

## 2018-02-08 DIAGNOSIS — M1990 Unspecified osteoarthritis, unspecified site: ICD-10-CM

## 2018-02-08 DIAGNOSIS — I10 Essential (primary) hypertension: ICD-10-CM

## 2018-02-08 DIAGNOSIS — H9190 Unspecified hearing loss, unspecified ear: ICD-10-CM

## 2018-02-08 DIAGNOSIS — E785 Hyperlipidemia, unspecified: Principal | ICD-10-CM

## 2018-02-08 DIAGNOSIS — C73 Malignant neoplasm of thyroid gland: ICD-10-CM

## 2018-02-08 DIAGNOSIS — C4491 Basal cell carcinoma of skin, unspecified: ICD-10-CM

## 2018-02-08 DIAGNOSIS — K219 Gastro-esophageal reflux disease without esophagitis: ICD-10-CM

## 2018-02-08 DIAGNOSIS — H5440 Blindness, one eye, unspecified eye: ICD-10-CM

## 2018-02-12 ENCOUNTER — Ambulatory Visit: Admit: 2018-02-12 | Discharge: 2018-02-12 | Payer: MEDICARE

## 2018-02-12 ENCOUNTER — Encounter: Admit: 2018-02-12 | Discharge: 2018-02-12 | Payer: MEDICARE

## 2018-02-12 DIAGNOSIS — I2510 Atherosclerotic heart disease of native coronary artery without angina pectoris: ICD-10-CM

## 2018-02-12 DIAGNOSIS — I480 Paroxysmal atrial fibrillation: ICD-10-CM

## 2018-02-12 DIAGNOSIS — I4819 Other persistent atrial fibrillation: ICD-10-CM

## 2018-02-12 DIAGNOSIS — Z951 Presence of aortocoronary bypass graft: ICD-10-CM

## 2018-02-12 DIAGNOSIS — Z79899 Other long term (current) drug therapy: ICD-10-CM

## 2018-02-12 DIAGNOSIS — I10 Essential (primary) hypertension: ICD-10-CM

## 2018-02-12 DIAGNOSIS — I495 Sick sinus syndrome: Principal | ICD-10-CM

## 2018-02-12 DIAGNOSIS — Z950 Presence of cardiac pacemaker: Secondary | ICD-10-CM

## 2018-02-12 DIAGNOSIS — I4891 Unspecified atrial fibrillation: ICD-10-CM

## 2018-02-12 DIAGNOSIS — M1990 Unspecified osteoarthritis, unspecified site: ICD-10-CM

## 2018-02-12 DIAGNOSIS — G2581 Restless legs syndrome: ICD-10-CM

## 2018-02-12 DIAGNOSIS — Z9861 Coronary angioplasty status: ICD-10-CM

## 2018-02-12 DIAGNOSIS — C73 Malignant neoplasm of thyroid gland: ICD-10-CM

## 2018-02-12 DIAGNOSIS — K219 Gastro-esophageal reflux disease without esophagitis: ICD-10-CM

## 2018-02-12 DIAGNOSIS — I25810 Atherosclerosis of coronary artery bypass graft(s) without angina pectoris: Secondary | ICD-10-CM

## 2018-02-12 DIAGNOSIS — H9190 Unspecified hearing loss, unspecified ear: ICD-10-CM

## 2018-02-12 DIAGNOSIS — E785 Hyperlipidemia, unspecified: Principal | ICD-10-CM

## 2018-02-12 DIAGNOSIS — H5440 Blindness, one eye, unspecified eye: ICD-10-CM

## 2018-02-12 DIAGNOSIS — C4491 Basal cell carcinoma of skin, unspecified: ICD-10-CM

## 2018-02-13 ENCOUNTER — Encounter: Admit: 2018-02-13 | Discharge: 2018-02-13 | Payer: MEDICARE

## 2018-02-13 DIAGNOSIS — Z950 Presence of cardiac pacemaker: ICD-10-CM

## 2018-02-13 DIAGNOSIS — C73 Malignant neoplasm of thyroid gland: ICD-10-CM

## 2018-02-13 DIAGNOSIS — I2510 Atherosclerotic heart disease of native coronary artery without angina pectoris: ICD-10-CM

## 2018-02-13 DIAGNOSIS — I10 Essential (primary) hypertension: ICD-10-CM

## 2018-02-13 DIAGNOSIS — I4891 Unspecified atrial fibrillation: ICD-10-CM

## 2018-02-13 DIAGNOSIS — I4819 Other persistent atrial fibrillation: ICD-10-CM

## 2018-02-13 DIAGNOSIS — C4491 Basal cell carcinoma of skin, unspecified: ICD-10-CM

## 2018-02-13 DIAGNOSIS — H5440 Blindness, one eye, unspecified eye: ICD-10-CM

## 2018-02-13 DIAGNOSIS — G2581 Restless legs syndrome: ICD-10-CM

## 2018-02-13 DIAGNOSIS — I480 Paroxysmal atrial fibrillation: ICD-10-CM

## 2018-02-13 DIAGNOSIS — M1990 Unspecified osteoarthritis, unspecified site: ICD-10-CM

## 2018-02-13 DIAGNOSIS — E785 Hyperlipidemia, unspecified: Principal | ICD-10-CM

## 2018-02-13 DIAGNOSIS — K219 Gastro-esophageal reflux disease without esophagitis: ICD-10-CM

## 2018-02-13 DIAGNOSIS — H9190 Unspecified hearing loss, unspecified ear: ICD-10-CM

## 2018-02-21 ENCOUNTER — Ambulatory Visit: Admit: 2018-02-21 | Discharge: 2018-02-22 | Payer: MEDICARE

## 2018-02-22 ENCOUNTER — Encounter: Admit: 2018-02-22 | Discharge: 2018-02-22 | Payer: MEDICARE

## 2018-02-22 DIAGNOSIS — I4819 Other persistent atrial fibrillation: ICD-10-CM

## 2018-02-22 DIAGNOSIS — G4733 Obstructive sleep apnea (adult) (pediatric): Principal | ICD-10-CM

## 2018-02-22 DIAGNOSIS — D333 Benign neoplasm of cranial nerves: Principal | ICD-10-CM

## 2018-02-22 DIAGNOSIS — E890 Postprocedural hypothyroidism: Principal | ICD-10-CM

## 2018-03-01 ENCOUNTER — Ambulatory Visit: Admit: 2018-03-01 | Discharge: 2018-03-02 | Payer: MEDICARE

## 2018-03-02 ENCOUNTER — Encounter: Admit: 2018-03-02 | Discharge: 2018-03-02 | Payer: MEDICARE

## 2018-03-02 DIAGNOSIS — Z959 Presence of cardiac and vascular implant and graft, unspecified: ICD-10-CM

## 2018-03-02 DIAGNOSIS — I495 Sick sinus syndrome: Principal | ICD-10-CM

## 2018-03-02 DIAGNOSIS — I480 Paroxysmal atrial fibrillation: Secondary | ICD-10-CM

## 2018-03-05 ENCOUNTER — Encounter: Admit: 2018-03-05 | Discharge: 2018-03-05 | Payer: MEDICARE

## 2018-03-19 ENCOUNTER — Encounter: Admit: 2018-03-19 | Discharge: 2018-03-19 | Payer: MEDICARE

## 2018-03-19 DIAGNOSIS — E890 Postprocedural hypothyroidism: Principal | ICD-10-CM

## 2018-03-19 MED ORDER — LEVOTHYROXINE 150 MCG PO TAB
ORAL_TABLET | Freq: Every morning | 2 refills
Start: 2018-03-19 — End: ?

## 2018-03-20 ENCOUNTER — Encounter: Admit: 2018-03-20 | Discharge: 2018-03-20 | Payer: MEDICARE

## 2018-03-20 DIAGNOSIS — C73 Malignant neoplasm of thyroid gland: Principal | ICD-10-CM

## 2018-03-21 ENCOUNTER — Ambulatory Visit: Admit: 2018-03-21 | Discharge: 2018-03-21 | Payer: MEDICARE

## 2018-03-21 ENCOUNTER — Encounter: Admit: 2018-03-21 | Discharge: 2018-03-21 | Payer: MEDICARE

## 2018-03-21 DIAGNOSIS — D333 Benign neoplasm of cranial nerves: ICD-10-CM

## 2018-03-21 DIAGNOSIS — H6121 Impacted cerumen, right ear: ICD-10-CM

## 2018-03-21 DIAGNOSIS — H9113 Presbycusis, bilateral: ICD-10-CM

## 2018-03-21 DIAGNOSIS — H9190 Unspecified hearing loss, unspecified ear: ICD-10-CM

## 2018-03-21 DIAGNOSIS — H903 Sensorineural hearing loss, bilateral: Principal | ICD-10-CM

## 2018-03-21 DIAGNOSIS — G2581 Restless legs syndrome: ICD-10-CM

## 2018-03-21 DIAGNOSIS — C73 Malignant neoplasm of thyroid gland: ICD-10-CM

## 2018-03-21 DIAGNOSIS — I4819 Other persistent atrial fibrillation: ICD-10-CM

## 2018-03-21 DIAGNOSIS — I4891 Unspecified atrial fibrillation: ICD-10-CM

## 2018-03-21 DIAGNOSIS — E785 Hyperlipidemia, unspecified: Principal | ICD-10-CM

## 2018-03-21 DIAGNOSIS — Z950 Presence of cardiac pacemaker: ICD-10-CM

## 2018-03-21 DIAGNOSIS — H7402 Tympanosclerosis, left ear: ICD-10-CM

## 2018-03-21 DIAGNOSIS — I10 Essential (primary) hypertension: ICD-10-CM

## 2018-03-21 DIAGNOSIS — C4491 Basal cell carcinoma of skin, unspecified: ICD-10-CM

## 2018-03-21 DIAGNOSIS — M1990 Unspecified osteoarthritis, unspecified site: ICD-10-CM

## 2018-03-21 DIAGNOSIS — I480 Paroxysmal atrial fibrillation: ICD-10-CM

## 2018-03-21 DIAGNOSIS — K219 Gastro-esophageal reflux disease without esophagitis: ICD-10-CM

## 2018-03-21 DIAGNOSIS — H5440 Blindness, one eye, unspecified eye: ICD-10-CM

## 2018-03-21 DIAGNOSIS — I2510 Atherosclerotic heart disease of native coronary artery without angina pectoris: ICD-10-CM

## 2018-03-21 MED ORDER — LEVOTHYROXINE 150 MCG PO TAB
150 ug | ORAL_TABLET | Freq: Every day | ORAL | 0 refills | 30.00000 days | Status: AC
Start: 2018-03-21 — End: 2018-06-22

## 2018-04-04 ENCOUNTER — Encounter: Admit: 2018-04-04 | Discharge: 2018-04-04 | Payer: MEDICARE

## 2018-04-04 DIAGNOSIS — C73 Malignant neoplasm of thyroid gland: Principal | ICD-10-CM

## 2018-04-04 LAB — THYROGLOBULIN & THYROGLOBULIN AB
Lab: 0.4 ng/mL — ABNORMAL LOW (ref 27.0–33.0)
Lab: <1 [IU]/mL (ref <=1)

## 2018-04-04 LAB — TSH WITH FREE T4 REFLEX: Lab: 1.1 m[IU]/mL — ABNORMAL LOW (ref 0.35–4.94)

## 2018-04-19 ENCOUNTER — Encounter: Admit: 2018-04-19 | Discharge: 2018-04-19 | Payer: MEDICARE

## 2018-04-24 ENCOUNTER — Encounter: Admit: 2018-04-24 | Discharge: 2018-04-24 | Payer: MEDICARE

## 2018-04-24 MED ORDER — AMIODARONE 200 MG PO TAB
ORAL_TABLET | Freq: Every day | ORAL | 1 refills | 42.00000 days | Status: AC
Start: 2018-04-24 — End: 2018-07-24

## 2018-04-27 ENCOUNTER — Encounter: Admit: 2018-04-27 | Discharge: 2018-04-27 | Payer: MEDICARE

## 2018-04-27 ENCOUNTER — Ambulatory Visit: Admit: 2018-04-27 | Discharge: 2018-04-27 | Payer: MEDICARE

## 2018-04-27 DIAGNOSIS — H9190 Unspecified hearing loss, unspecified ear: Secondary | ICD-10-CM

## 2018-04-27 DIAGNOSIS — C4491 Basal cell carcinoma of skin, unspecified: Secondary | ICD-10-CM

## 2018-04-27 DIAGNOSIS — I2510 Atherosclerotic heart disease of native coronary artery without angina pectoris: Secondary | ICD-10-CM

## 2018-04-27 DIAGNOSIS — G2581 Restless legs syndrome: Secondary | ICD-10-CM

## 2018-04-27 DIAGNOSIS — G4733 Obstructive sleep apnea (adult) (pediatric): Secondary | ICD-10-CM

## 2018-04-27 DIAGNOSIS — H5440 Blindness, one eye, unspecified eye: Secondary | ICD-10-CM

## 2018-04-27 DIAGNOSIS — I4891 Unspecified atrial fibrillation: Secondary | ICD-10-CM

## 2018-04-27 DIAGNOSIS — M1990 Unspecified osteoarthritis, unspecified site: Secondary | ICD-10-CM

## 2018-04-27 DIAGNOSIS — I4819 Other persistent atrial fibrillation: Secondary | ICD-10-CM

## 2018-04-27 DIAGNOSIS — Z950 Presence of cardiac pacemaker: Secondary | ICD-10-CM

## 2018-04-27 DIAGNOSIS — C73 Malignant neoplasm of thyroid gland: Secondary | ICD-10-CM

## 2018-04-27 DIAGNOSIS — K219 Gastro-esophageal reflux disease without esophagitis: Secondary | ICD-10-CM

## 2018-04-27 DIAGNOSIS — E785 Hyperlipidemia, unspecified: Secondary | ICD-10-CM

## 2018-04-27 DIAGNOSIS — Z79899 Other long term (current) drug therapy: Secondary | ICD-10-CM

## 2018-04-27 DIAGNOSIS — I10 Essential (primary) hypertension: Secondary | ICD-10-CM

## 2018-04-27 DIAGNOSIS — I480 Paroxysmal atrial fibrillation: Secondary | ICD-10-CM

## 2018-05-21 ENCOUNTER — Encounter: Admit: 2018-05-21 | Discharge: 2018-05-21 | Payer: MEDICARE

## 2018-05-23 ENCOUNTER — Encounter: Admit: 2018-05-23 | Discharge: 2018-05-23 | Payer: MEDICARE

## 2018-05-23 NOTE — Telephone Encounter
Received request from DME for provider to sign form necessary for billing. Form completed and given to provider for signature.    Form will be faxed to [employer] and a copy scanned to pt's chart.

## 2018-05-31 ENCOUNTER — Ambulatory Visit: Admit: 2018-05-31 | Discharge: 2018-06-01 | Payer: MEDICARE

## 2018-06-01 DIAGNOSIS — Z959 Presence of cardiac and vascular implant and graft, unspecified: ICD-10-CM

## 2018-06-01 DIAGNOSIS — I495 Sick sinus syndrome: Principal | ICD-10-CM

## 2018-06-01 DIAGNOSIS — I480 Paroxysmal atrial fibrillation: ICD-10-CM

## 2018-06-13 ENCOUNTER — Encounter: Admit: 2018-06-13 | Discharge: 2018-06-13 | Payer: MEDICARE

## 2018-06-13 ENCOUNTER — Ambulatory Visit: Admit: 2018-06-13 | Discharge: 2018-06-14 | Payer: MEDICARE

## 2018-06-13 DIAGNOSIS — I2510 Atherosclerotic heart disease of native coronary artery without angina pectoris: Secondary | ICD-10-CM

## 2018-06-13 DIAGNOSIS — M1990 Unspecified osteoarthritis, unspecified site: ICD-10-CM

## 2018-06-13 DIAGNOSIS — H9190 Unspecified hearing loss, unspecified ear: ICD-10-CM

## 2018-06-13 DIAGNOSIS — E785 Hyperlipidemia, unspecified: Principal | ICD-10-CM

## 2018-06-13 DIAGNOSIS — C73 Malignant neoplasm of thyroid gland: ICD-10-CM

## 2018-06-13 DIAGNOSIS — I4819 Other persistent atrial fibrillation: ICD-10-CM

## 2018-06-13 DIAGNOSIS — Z950 Presence of cardiac pacemaker: ICD-10-CM

## 2018-06-13 DIAGNOSIS — J101 Influenza due to other identified influenza virus with other respiratory manifestations: ICD-10-CM

## 2018-06-13 DIAGNOSIS — I480 Paroxysmal atrial fibrillation: ICD-10-CM

## 2018-06-13 DIAGNOSIS — C4491 Basal cell carcinoma of skin, unspecified: ICD-10-CM

## 2018-06-13 DIAGNOSIS — I4891 Unspecified atrial fibrillation: ICD-10-CM

## 2018-06-13 DIAGNOSIS — G2581 Restless legs syndrome: ICD-10-CM

## 2018-06-13 DIAGNOSIS — I10 Essential (primary) hypertension: ICD-10-CM

## 2018-06-13 DIAGNOSIS — H5440 Blindness, one eye, unspecified eye: ICD-10-CM

## 2018-06-13 DIAGNOSIS — K219 Gastro-esophageal reflux disease without esophagitis: ICD-10-CM

## 2018-06-13 NOTE — Progress Notes
Date of Service: 06/13/2018     Subjective:             Kenneth Buchanan is a 78 y.o. male.    History of Present Illness    Pt presents for follow up visit regarding thyroid problem.  He was last seen by Dr Kirtland Bouchard 03/2017.     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  None of the 10 lymph nodes from central neck dissection were cancerous.   His postoperative thyroglobulin was 0.8 and antibody less than 3 on November 25, 2014.  Unfortunately, there was no TSH at that time.     No obstructive sx    Results for OWEN, PRATTE (MRN [id number]) as of 06/13/2018 15:39   Ref. Range 03/21/2018 13:27   Thyroglobulin Latest Units: ng/mL 0.4 (L)   Thyroglobulin Ab Latest Ref Range: <=1 iU/mL <1       [place] 11/2017  IMPRESSION  1. ???Stable size of a single, tiny, elongated nodule in the inferior left   thyroid bed over the past year. This is again favored for benign process,   such as a tiny reactive lymph node or postoperative scarring.  2. ???No enlarged or morphologically suspicious cervical lymph nodes.        Hypothyroidism  He takes Lt4 regularly in the morning on empty stomach. Current dose is 150 mcg daily.  He has a history of CAD and paroxysmal atrial fibrillation. He has been on amiodarone in the past four years.   His weight has been fairly stable.   He denied history of head and neck radiation exposure but he retired from [employer] radiation in which he was exposed to [employer].       Results for ADAIR, LAUDERBACK (MRN [id number]) as of 06/13/2018 15:39   Ref. Range 03/21/2018 13:27   TSH Latest Ref Range: 0.35 - 4.94 miU/mL 1.18             Family History:  Denied family history of thyroid cancer.  Mother had coronary artery disease and passed away at age 66.  Denied any head or Peripheral Circulation: normal peripheral circulation   Lower Extremity Edema: no lower extremity edema   Abdominal Exam: mildly protuberant but soft, non-tender, no masses, no purple striae  Gait & Station: walks without assistance   Muscle Strength: normal muscle tone   Orientation: oriented to time, place and person   Affect & Mood: appropriate and sustained affect   Language and Memory: patient responsive and seems to comprehend information   Neurologic Exam: neurological assessment grossly intact   Other: moves all extremities, No tremor        Results for JOVANE, FOUTZ (MRN [id number]) as of 04/01/2017 12:31   Ref. Range 03/31/2017 15:55   T4-Free Latest Ref Range: 0.6 - 1.6 NG/DL 1.3   TSH Latest Ref Range: 0.35 - 5.00 MCU/ML 3.470   Thyroglobulin Ab Latest Ref Range: <4.11 IU/ML <3.00   Thyroglobulin Latest Ref Range: 1.7 - 55.0 NG/ML 0.3 (L)   Cholesterol Latest Ref Range: <200 MG/DL 956   Triglycerides Latest Ref Range: <150 MG/DL 213 (H)   HDL Latest Ref Range: >40 MG/DL 42   LDL Latest Ref Range: <100 MG/DL 086 (H)   VLDL Latest  Units: MG/DL 32   Non HDL Cholesterol Latest Units: MG/DL 161       Assessment and Plan:            Thyroid cancer  No obstructive sx  Surveillance testing is UTD    Postsurgical hypothyroidism  Recent TSH at goal (< 2)  Continue same LT4,150 mcg daily

## 2018-06-14 DIAGNOSIS — E890 Postprocedural hypothyroidism: ICD-10-CM

## 2018-06-14 DIAGNOSIS — C73 Malignant neoplasm of thyroid gland: Principal | ICD-10-CM

## 2018-06-19 ENCOUNTER — Encounter: Admit: 2018-06-19 | Discharge: 2018-06-19 | Payer: MEDICARE

## 2018-06-19 DIAGNOSIS — E890 Postprocedural hypothyroidism: Principal | ICD-10-CM

## 2018-06-21 ENCOUNTER — Encounter: Admit: 2018-06-21 | Discharge: 2018-06-21 | Payer: MEDICARE

## 2018-06-22 ENCOUNTER — Encounter: Admit: 2018-06-22 | Discharge: 2018-06-22 | Payer: MEDICARE

## 2018-06-22 ENCOUNTER — Ambulatory Visit: Admit: 2018-06-22 | Discharge: 2018-06-22 | Payer: MEDICARE

## 2018-06-22 DIAGNOSIS — G4733 Obstructive sleep apnea (adult) (pediatric): ICD-10-CM

## 2018-06-22 DIAGNOSIS — G2581 Restless legs syndrome: ICD-10-CM

## 2018-06-22 DIAGNOSIS — Z950 Presence of cardiac pacemaker: ICD-10-CM

## 2018-06-22 DIAGNOSIS — I4819 Other persistent atrial fibrillation: ICD-10-CM

## 2018-06-22 DIAGNOSIS — M1990 Unspecified osteoarthritis, unspecified site: ICD-10-CM

## 2018-06-22 DIAGNOSIS — H9190 Unspecified hearing loss, unspecified ear: ICD-10-CM

## 2018-06-22 DIAGNOSIS — I10 Essential (primary) hypertension: ICD-10-CM

## 2018-06-22 DIAGNOSIS — E785 Hyperlipidemia, unspecified: Principal | ICD-10-CM

## 2018-06-22 DIAGNOSIS — C4491 Basal cell carcinoma of skin, unspecified: ICD-10-CM

## 2018-06-22 DIAGNOSIS — I472 Ventricular tachycardia: ICD-10-CM

## 2018-06-22 DIAGNOSIS — E890 Postprocedural hypothyroidism: Principal | ICD-10-CM

## 2018-06-22 DIAGNOSIS — H5440 Blindness, one eye, unspecified eye: ICD-10-CM

## 2018-06-22 DIAGNOSIS — I2510 Atherosclerotic heart disease of native coronary artery without angina pectoris: ICD-10-CM

## 2018-06-22 DIAGNOSIS — J101 Influenza due to other identified influenza virus with other respiratory manifestations: ICD-10-CM

## 2018-06-22 DIAGNOSIS — I495 Sick sinus syndrome: Principal | ICD-10-CM

## 2018-06-22 DIAGNOSIS — Z79899 Other long term (current) drug therapy: ICD-10-CM

## 2018-06-22 DIAGNOSIS — C73 Malignant neoplasm of thyroid gland: ICD-10-CM

## 2018-06-22 DIAGNOSIS — Z7901 Long term (current) use of anticoagulants: Secondary | ICD-10-CM

## 2018-06-22 DIAGNOSIS — Z951 Presence of aortocoronary bypass graft: ICD-10-CM

## 2018-06-22 DIAGNOSIS — I480 Paroxysmal atrial fibrillation: ICD-10-CM

## 2018-06-22 DIAGNOSIS — K219 Gastro-esophageal reflux disease without esophagitis: ICD-10-CM

## 2018-06-22 DIAGNOSIS — I4891 Unspecified atrial fibrillation: ICD-10-CM

## 2018-06-22 MED ORDER — LEVOTHYROXINE 150 MCG PO TAB
150 ug | ORAL_TABLET | Freq: Every day | ORAL | 3 refills | 30.00000 days | Status: AC
Start: 2018-06-22 — End: 2019-05-13

## 2018-06-25 ENCOUNTER — Encounter: Admit: 2018-06-25 | Discharge: 2018-06-25 | Payer: MEDICARE

## 2018-06-25 DIAGNOSIS — E785 Hyperlipidemia, unspecified: Principal | ICD-10-CM

## 2018-06-25 DIAGNOSIS — H9190 Unspecified hearing loss, unspecified ear: ICD-10-CM

## 2018-06-25 DIAGNOSIS — J101 Influenza due to other identified influenza virus with other respiratory manifestations: ICD-10-CM

## 2018-06-25 DIAGNOSIS — M1990 Unspecified osteoarthritis, unspecified site: ICD-10-CM

## 2018-06-25 DIAGNOSIS — I480 Paroxysmal atrial fibrillation: ICD-10-CM

## 2018-06-25 DIAGNOSIS — Z950 Presence of cardiac pacemaker: ICD-10-CM

## 2018-06-25 DIAGNOSIS — K219 Gastro-esophageal reflux disease without esophagitis: ICD-10-CM

## 2018-06-25 DIAGNOSIS — I4891 Unspecified atrial fibrillation: ICD-10-CM

## 2018-06-25 DIAGNOSIS — C4491 Basal cell carcinoma of skin, unspecified: ICD-10-CM

## 2018-06-25 DIAGNOSIS — G2581 Restless legs syndrome: ICD-10-CM

## 2018-06-25 DIAGNOSIS — I10 Essential (primary) hypertension: ICD-10-CM

## 2018-06-25 DIAGNOSIS — I2510 Atherosclerotic heart disease of native coronary artery without angina pectoris: Secondary | ICD-10-CM

## 2018-06-25 DIAGNOSIS — H5440 Blindness, one eye, unspecified eye: ICD-10-CM

## 2018-06-25 DIAGNOSIS — C73 Malignant neoplasm of thyroid gland: ICD-10-CM

## 2018-06-25 DIAGNOSIS — I4819 Other persistent atrial fibrillation: ICD-10-CM

## 2018-07-03 ENCOUNTER — Encounter: Admit: 2018-07-03 | Discharge: 2018-07-03 | Payer: MEDICARE

## 2018-07-05 ENCOUNTER — Encounter: Admit: 2018-07-05 | Discharge: 2018-07-05 | Payer: MEDICARE

## 2018-07-05 ENCOUNTER — Ambulatory Visit: Admit: 2018-07-05 | Discharge: 2018-07-05 | Payer: MEDICARE

## 2018-07-05 DIAGNOSIS — Z8249 Family history of ischemic heart disease and other diseases of the circulatory system: ICD-10-CM

## 2018-07-05 DIAGNOSIS — I495 Sick sinus syndrome: ICD-10-CM

## 2018-07-05 DIAGNOSIS — I4819 Other persistent atrial fibrillation: Principal | ICD-10-CM

## 2018-07-05 MED ORDER — SODIUM CHLORIDE 0.9 % IV SOLP
250 mL | INTRAVENOUS | 0 refills | Status: AC | PRN
Start: 2018-07-05 — End: ?

## 2018-07-05 MED ORDER — ALBUTEROL SULFATE 90 MCG/ACTUATION IN HFAA
2 | RESPIRATORY_TRACT | 0 refills | Status: DC | PRN
Start: 2018-07-05 — End: 2018-07-10

## 2018-07-05 MED ORDER — AMINOPHYLLINE 500 MG/20 ML IV SOLN
50 mg | INTRAVENOUS | 0 refills | Status: AC | PRN
Start: 2018-07-05 — End: ?

## 2018-07-05 MED ORDER — BENZOCAINE-MENTHOL 6-10 MG MM LOZG
1 | Freq: Once | ORAL | 0 refills | Status: AC | PRN
Start: 2018-07-05 — End: ?

## 2018-07-05 MED ORDER — REGADENOSON 0.4 MG/5 ML IV SYRG
.4 mg | Freq: Once | INTRAVENOUS | 0 refills | Status: DC
Start: 2018-07-05 — End: 2018-07-10

## 2018-07-05 MED ORDER — LEVOTHYROXINE 150 MCG PO TAB
ORAL_TABLET | Freq: Every day | 0 refills
Start: 2018-07-05 — End: ?

## 2018-07-05 MED ORDER — NITROGLYCERIN 0.4 MG SL SUBL
.4 mg | SUBLINGUAL | 0 refills | Status: AC | PRN
Start: 2018-07-05 — End: ?

## 2018-07-16 ENCOUNTER — Encounter: Admit: 2018-07-16 | Discharge: 2018-07-16 | Payer: MEDICARE

## 2018-07-16 NOTE — Telephone Encounter
Called and discussed results with patient.  No questions at this time.  Pt will callback with any questions, concerns or problems.

## 2018-07-19 ENCOUNTER — Encounter: Admit: 2018-07-19 | Discharge: 2018-07-19 | Payer: MEDICARE

## 2018-07-20 ENCOUNTER — Encounter: Admit: 2018-07-20 | Discharge: 2018-07-20 | Payer: MEDICARE

## 2018-07-24 ENCOUNTER — Encounter: Admit: 2018-07-24 | Discharge: 2018-07-24 | Payer: MEDICARE

## 2018-07-24 ENCOUNTER — Ambulatory Visit: Admit: 2018-07-24 | Discharge: 2018-07-25 | Payer: MEDICARE

## 2018-07-24 DIAGNOSIS — Z79899 Other long term (current) drug therapy: ICD-10-CM

## 2018-07-24 DIAGNOSIS — C73 Malignant neoplasm of thyroid gland: ICD-10-CM

## 2018-07-24 DIAGNOSIS — G2581 Restless legs syndrome: ICD-10-CM

## 2018-07-24 DIAGNOSIS — E785 Hyperlipidemia, unspecified: Principal | ICD-10-CM

## 2018-07-24 DIAGNOSIS — I4819 Other persistent atrial fibrillation: ICD-10-CM

## 2018-07-24 DIAGNOSIS — C4491 Basal cell carcinoma of skin, unspecified: ICD-10-CM

## 2018-07-24 DIAGNOSIS — H9190 Unspecified hearing loss, unspecified ear: ICD-10-CM

## 2018-07-24 DIAGNOSIS — K219 Gastro-esophageal reflux disease without esophagitis: ICD-10-CM

## 2018-07-24 DIAGNOSIS — H5440 Blindness, one eye, unspecified eye: ICD-10-CM

## 2018-07-24 DIAGNOSIS — I4891 Unspecified atrial fibrillation: ICD-10-CM

## 2018-07-24 DIAGNOSIS — Z950 Presence of cardiac pacemaker: ICD-10-CM

## 2018-07-24 DIAGNOSIS — I480 Paroxysmal atrial fibrillation: ICD-10-CM

## 2018-07-24 DIAGNOSIS — M1990 Unspecified osteoarthritis, unspecified site: ICD-10-CM

## 2018-07-24 DIAGNOSIS — G4733 Obstructive sleep apnea (adult) (pediatric): ICD-10-CM

## 2018-07-24 DIAGNOSIS — I2510 Atherosclerotic heart disease of native coronary artery without angina pectoris: ICD-10-CM

## 2018-07-24 DIAGNOSIS — I10 Essential (primary) hypertension: ICD-10-CM

## 2018-07-24 DIAGNOSIS — Z7901 Long term (current) use of anticoagulants: ICD-10-CM

## 2018-07-24 DIAGNOSIS — I495 Sick sinus syndrome: Principal | ICD-10-CM

## 2018-07-24 DIAGNOSIS — Z951 Presence of aortocoronary bypass graft: ICD-10-CM

## 2018-07-24 DIAGNOSIS — J101 Influenza due to other identified influenza virus with other respiratory manifestations: ICD-10-CM

## 2018-07-24 MED ORDER — AMIODARONE 200 MG PO TAB
200 mg | ORAL_TABLET | Freq: Every day | ORAL | 3 refills | 42.00000 days | Status: DC
Start: 2018-07-24 — End: 2018-08-03

## 2018-08-03 ENCOUNTER — Encounter: Admit: 2018-08-03 | Discharge: 2018-08-03 | Payer: MEDICARE

## 2018-08-03 MED ORDER — AMIODARONE 200 MG PO TAB
200 mg | ORAL_TABLET | Freq: Every day | ORAL | 0 refills | 42.00000 days | Status: DC
Start: 2018-08-03 — End: 2018-11-15

## 2018-08-10 ENCOUNTER — Encounter: Admit: 2018-08-10 | Discharge: 2018-08-10 | Payer: MEDICARE

## 2018-08-10 DIAGNOSIS — E7849 Other hyperlipidemia: Principal | ICD-10-CM

## 2018-08-10 DIAGNOSIS — Z950 Presence of cardiac pacemaker: ICD-10-CM

## 2018-08-10 MED ORDER — ATORVASTATIN 80 MG PO TAB
ORAL_TABLET | Freq: Every day | 2 refills | Status: DC
Start: 2018-08-10 — End: 2019-05-12

## 2018-08-30 ENCOUNTER — Ambulatory Visit: Admit: 2018-08-30 | Discharge: 2018-08-30 | Payer: MEDICARE

## 2018-08-30 ENCOUNTER — Encounter: Admit: 2018-08-30 | Discharge: 2018-08-30 | Payer: MEDICARE

## 2018-08-30 DIAGNOSIS — I480 Paroxysmal atrial fibrillation: ICD-10-CM

## 2018-08-30 DIAGNOSIS — Z959 Presence of cardiac and vascular implant and graft, unspecified: ICD-10-CM

## 2018-08-30 DIAGNOSIS — I495 Sick sinus syndrome: Principal | ICD-10-CM

## 2018-09-18 ENCOUNTER — Ambulatory Visit: Admit: 2018-09-18 | Discharge: 2018-09-18

## 2018-09-18 ENCOUNTER — Encounter: Admit: 2018-09-18 | Discharge: 2018-09-18

## 2018-09-18 DIAGNOSIS — Z950 Presence of cardiac pacemaker: Secondary | ICD-10-CM

## 2018-09-18 DIAGNOSIS — Z955 Presence of coronary angioplasty implant and graft: Secondary | ICD-10-CM

## 2018-09-18 DIAGNOSIS — I480 Paroxysmal atrial fibrillation: Secondary | ICD-10-CM

## 2018-09-18 DIAGNOSIS — I4819 Other persistent atrial fibrillation: Secondary | ICD-10-CM

## 2018-09-18 DIAGNOSIS — C73 Malignant neoplasm of thyroid gland: Secondary | ICD-10-CM

## 2018-09-18 DIAGNOSIS — I2510 Atherosclerotic heart disease of native coronary artery without angina pectoris: Secondary | ICD-10-CM

## 2018-09-18 DIAGNOSIS — J101 Influenza due to other identified influenza virus with other respiratory manifestations: Secondary | ICD-10-CM

## 2018-09-18 DIAGNOSIS — I495 Sick sinus syndrome: Secondary | ICD-10-CM

## 2018-09-18 DIAGNOSIS — Z951 Presence of aortocoronary bypass graft: Secondary | ICD-10-CM

## 2018-09-18 DIAGNOSIS — I4891 Unspecified atrial fibrillation: Secondary | ICD-10-CM

## 2018-09-18 DIAGNOSIS — Z8679 Personal history of other diseases of the circulatory system: Secondary | ICD-10-CM

## 2018-09-18 DIAGNOSIS — I472 Ventricular tachycardia: Secondary | ICD-10-CM

## 2018-09-18 DIAGNOSIS — M1990 Unspecified osteoarthritis, unspecified site: Secondary | ICD-10-CM

## 2018-09-18 DIAGNOSIS — Z9861 Coronary angioplasty status: Secondary | ICD-10-CM

## 2018-09-18 DIAGNOSIS — G4733 Obstructive sleep apnea (adult) (pediatric): Secondary | ICD-10-CM

## 2018-09-18 DIAGNOSIS — K219 Gastro-esophageal reflux disease without esophagitis: Secondary | ICD-10-CM

## 2018-09-18 DIAGNOSIS — G2581 Restless legs syndrome: Secondary | ICD-10-CM

## 2018-09-18 DIAGNOSIS — I10 Essential (primary) hypertension: Secondary | ICD-10-CM

## 2018-09-18 DIAGNOSIS — C4491 Basal cell carcinoma of skin, unspecified: Secondary | ICD-10-CM

## 2018-09-18 DIAGNOSIS — E785 Hyperlipidemia, unspecified: Secondary | ICD-10-CM

## 2018-09-18 DIAGNOSIS — I491 Atrial premature depolarization: Secondary | ICD-10-CM

## 2018-09-18 DIAGNOSIS — H5440 Blindness, one eye, unspecified eye: Secondary | ICD-10-CM

## 2018-09-18 DIAGNOSIS — H9190 Unspecified hearing loss, unspecified ear: Secondary | ICD-10-CM

## 2018-09-18 NOTE — Patient Instructions
-  CBC, CMP, TSH (okay to have done in [place])  -Acceptable risk to hold Xarelto for 2 days prior to potential left knee surgery (without bridging)  -Continue amiodarone 200 mg daily  -Discussed options including AF ablation, long-term amiodarone, or potential future AV node ablation -- let us know if you decide to proceed with ablation  -Continue Xarelto 15 mg daily (using lower dose than usual because of interaction with amiodarone)  -Continue aspirin 81 mg daily and atorvastatin 80 mg daily  -Good job cutting back alcohol   -Follow-up: 6 months  with Rod Holler (NP) and me in 1 year  -- with device interrogation during the visit -- please call us if you have symptoms in the interim     In order to provide you the best care possible we ask that you follow up as below:    Please consider signing up for mychart.    For all questions, please call the nursing triage voicemail at [phone] Monday - Friday 8-5 only. Please leave a detailed message with your name, date of birth, and reason for your call.     To schedule an appointment, please call [phone].     Please allow ~ 10 business days for the results of any testing to be reviewed. Please call our office if you have not heard from a nurse within this time frame      Discussed with the patient potential multi-organ toxicities from amiodarone including but not limited to: pulmonary (pulmonary fibrosis), hepatic, thyroid, skin, eye.    Outpatient Amiodarone Monitoring: The following will need to be arranged...  Every 6 months:   TSH  Liver function tests (Alkaline phosphatase, AST/ALT, and total bilirubin)   Annually:   Eye exam  Chest X-Ray  PFTs (Pulmonary function tests)

## 2018-09-18 NOTE — Progress Notes
Date of Service: 09/18/2018    Kenneth Buchanan is a 78 y.o. male.                History of Present Illness:     See Medical History/Course Below    Mr. Kenneth Buchanan is a very pleasant 78 y.o. man that follows with Dr. Avie Arenas. He lives in [place], [place].  He is married and has a son.  He enjoys bike riding (rides a mountain bike outdoors but not on single-track).  Retired from [employer].  Per his wife, he is not a complainer and that she voices much of the updates during the visits.    He presents today for follow-up having last seen me in October 2019.  He saw Windell Moulding in March.  He reports doing okay in the interim.  He had some shortness of breath with exertion and possible chest discomfort for which a stress study was performed which showed findings consistent with his known CAD history (CABG).  Dr. Avie Arenas saw him in April and decreased his amiodarone to 200 mg daily and is also arranging for an echocardiogram.    He has been on CPAP fr about 2-3 months.  He uses it 7-1/2 hours per night and has found himself able to go to sleep easier and had improved energy throughout the day.  He continues to have intermittent days of low energy levels but reports this is difficult to predict.  He denies chest pain but has a funny feeling on the right side of his chest or of flip-flop that lasts only 1-2 seconds.  He reports being aware of his breathing at times but is still able to do the same exercise regimen of biking 5 times per week at 30 to 40 minutes each time (about 7-8 miles).    He occasionally has some nausea in the morning after taking his levothyroxine and amiodarone.  He does not check his blood pressures at home but reports his blood pressures have been highly variable in clinic and seem to be worse when he is thinking about it.  He denies orthopnea, proximal nocturnal dyspnea, or lower extremity edema.    Impression:     # Long-standing persistent atrial fibrillation Follows with Dr. [name]  -CHADS2VASC of 4+ (age2, CAD, HTN) -- Xarelto 15 mg daily (if comes off amiodarone in the future will need to increase this to 20 mg daily)  -Monitoring: pacemaker  -AAD: amiodarone (off/on since 2014) -- Failed dofetilide (excessive Qtc prolongation, June 2017)  -Sx: occasional palpitations (but he ignoes them), shortness of breath with exertion  -OSA: Yes -- see below  -EtOH: 1-2 drinks/week (09/18/18) --  prior whiskey(2 shots per night) -- previous occasional beer (02/12/18)  02/21/13 TEE: EF 50%. No evidence of intracardiac thrombus.   02/21/13 DCCV: Successful DC cardioversion of Atrial fibrillation to sinus rhythm.   -11/14: Initial (Pers) AF, amio 200/d-Dr Hannen  -11/15 Decrease to 100/d  -12/16 Pers AF-DC amio  -08/03/15 Echo: LA 41 mm  -08/05/15 ZioPatch: Underlying rhythm was atrial fibrillation 100% of the time. Average heart rate is 68 bpm, patient is not on a beta-blocker. Episodes of tachycardia up to 201 bpm were noted. One 3 second pause occurred. Isolated ventricular tachycardia occurred, the longest episode lasted 5.2 seconds.??? In addition isolated ventricular ectopy was present and trigeminy.  -June 2017: DCCV   -7/17 Amio 200/d  -06/2016 Amio reduced to 1200/week  -11/28/2017 DC cardioversion  -02/05/2018 Dr. [name]: No AF. Complex discussion  regarding options.  Discussed likelihood of success with ablation at best of 50% with single procedure.  Discussed the goal would be to get him off amiodarone.  Discussed alternative of rate control strategy.  He and his wife will discuss further and get back with me.   -06/22/18 Windell Moulding: Continue to have low energy as well as dyspnea on exertion and chest tightness.  -April 2020: reduced amiodarone to 200 mg / day  -09/18/2018 Dr. [name]: If comes off amiodarone in the future we will increase Xarelto to 20 daily. Rec'd consideration of ablation to try and wean the amiodarone. 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 [name]): Left-sided [facility] [employer] dual-chamber pacemaker implantation, ILR removal, and cardioversion --plan for CT scan and may consider AF ablation in the future.  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 Mild MR. PAP 20 mmHg.   -08/05/15 Bruce thallium: This study is abnormal due to calculated lower left ventricular ejection fraction of 42%. This is likely due to the patient's rapid ventricular rates noted with exercise stress. His peak heart rate achieved was 204 beats per minute which was 140% predicted heart rate for age. This is due to rapid ventricular response with atrial fibrillation. There is no definite significant inducible ischemia present. The patient did have dyspnea with physical activity and he was able to exercise for a maximal duration of 7 minutes 24 seconds. ???  -09/03/15 LHC: Hemodynamically significant in-stent restenotic lesion in previously placed BMS to venous graft to RCA. Strongly positive fractional flow reserve assessment with reduction in FFR value from 0.94 to 0.72 within 45 seconds. Successful PCI of in-stent restenotic lesion in SVG to RCA with [id number] Herculink Elite stent with excellent angiographic results.   --09/18/2018 treadmill MPI: Partial evidence for slightly reversible uptake in the OM distribution laterally.  LVEF 49%.  LVEDV 116 mL.  Exercised for 8 and 45 seconds to max heart rate 90% predicted (achieved 128 bpm). 10 METS.  # OSA on CPAP  -02/21/2018 sleep study: Apnea hypotony index 19.8.  Moderate obstructive sleep disordered breathing with obstructive events associated with mild to moderate arterial oxygen desaturation  -09/18/18: CPAP helps  # Lung screening  -04/27/2018 PFTs: FEV1 108% predicted, FVC 113% predicted, FEV1/FVC 69%, DLCO 82% predicted, normal pulmonary function studies.  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 Royce Macadamia)  # Knee problems (had surgery around 2016 on left knee)    Personally reviewed his device check dated today.  He has a Investment banker, [employer] with an estimated 14.5 years of battery life remaining.  Lead testing is adequate as documented separately.  There was one reading with an elevated atrial threshold back in April.  This may have been an anomaly.  He atrial paces 83% of the time and ventricular paces 10% of the time.  Rate histograms are slightly improved since changes  last visit.  Nevertheless, we increased the rate response a bit to help with heart rate histograms.  The rest are as per separate device interrogation documentation    We discussed management of his atrial fibrillation once again.  He continues on a rhythm control strategy.  He will need to continue with anticoagulation to lower his elevated baseline risk of stroke.  He is on a reduced dose of Xarelto due to drug interaction with amiodarone.    We discussed management of rhythm control.  We once again discussed options including long-term amiodarone versus an ablation procedure.  Surprisingly, his left atrium does not seem particularly enlarged on the most recent echocardiogram in 2017 or on the TEE subsequent to that.  He is due for a follow-up echocardiogram and we will review his left atrial size on that.  Given this, I think ablation may be helpful for him getting off the amiodarone.  Would be best to avoid potential long-term toxicity with amiodarone.  He will continue to contemplate this further and get back with [place].    His chest awareness does not sound particularly concerning and he denied shortness of breath.  No further work-up is planned at the present time other than the echocardiogram Dr. Avie Arenas already planned.    His blood pressure control is acceptable today.  He will continue his current medications.    He has been successful at reducing his alcohol intake.    He will continue CPAP for obstructive sleep apnea.    It is a pleasure sharing in the care of this patient.  I will plan to have him follow-up with Windell Moulding in 6 months and me in a year.  He also sees Dr. Avie Arenas in the interim.     Plan:     -CBC, CMP, TSH (okay to have done in [place])  -Acceptable risk to hold Xarelto for 2 days prior to potential left knee surgery (without bridging)  -Continue amiodarone 200 mg daily  -Discussed options including AF ablation, long-term amiodarone, or potential future AV node ablation -- let us know if you decide to proceed with ablation  -Continue Xarelto 15 mg daily (using lower dose than usual because of interaction with amiodarone)  -Continue aspirin 81 mg daily and atorvastatin 80 mg daily  -Good job cutting back alcohol   -Follow-up: 6 months  with Windell Moulding (NP) and me in 1 year  -- with device interrogation during the visit -- please call us if you have symptoms in the interim     Please call us with any questions or concerns at [phone].    Discussed with the patient potential multi-organ toxicities from amiodarone including but not limited to: pulmonary (pulmonary fibrosis), hepatic, thyroid, skin, eye.    Outpatient Amiodarone Monitoring: The following will need to be arranged...  Every 6 months:   TSH Liver function tests (Alkaline phosphatase, AST/ALT, and total bilirubin)   Annually:   Eye exam  Chest X-Ray  PFTs (Pulmonary function tests)      Medication List:     ??? acetaminophen (TYLENOL) 500 mg tablet Take 1,000 mg by mouth as Needed for Pain.   ??? aflibercept(+) (EYLEA) 2 mg/0.05 mL intravitreal injection 2 mg by INTRAVITREAL route every 30 days.   ??? amiodarone (CORDARONE) 200 mg tablet Take one tablet by mouth daily. Take with food.   ??? aspirin EC 81 mg tablet Take one tablet by mouth daily. Hold for 7 days, resume 8/27   ??? atorvastatin (LIPITOR) 80 mg tablet  TAKE 1 TABLET BY MOUTH EVERY DAY   ??? levothyroxine (SYNTHROID) 150 mcg tablet Take one tablet by mouth daily 30 minutes before breakfast. Indications: a condition with low thyroid hormone levels   ??? nitroglycerin (NITROSTAT) 0.4 mg tablet Place 1 tablet under tongue every 5 minutes as needed for Chest Pain. Max of 3 tablets, call 911.   ??? rivaroxaban (XARELTO) 15 mg tablet Take one tablet by mouth daily with dinner. Take with food. (Patient taking differently: Take 15 mg by mouth daily with breakfast. Take with food.)   ??? timolol XE(+) (TIMOPTIC-XR) 0.5 % ophthalmic gel Place 1 drop into or around eye(s) daily.            Social History:     Social History     Socioeconomic History   ??? Marital status: Married     Spouse name: Okey Regal   ??? Number of children: 5   ??? Years of education: Not on file   ??? Highest education level: Not on file   Occupational History     Employer: RETIRED   Tobacco Use   ??? Smoking status: Former Smoker     Packs/day: 3.50     Years: 20.00     Pack years: 70.00     Types: Cigarettes     Last attempt to quit: 04/16/1982     Years since quitting: 36.4   ??? Smokeless tobacco: Never Used   Substance and Sexual Activity   ??? Alcohol use: Yes     Comment: 2 drinks per week    ??? Drug use: No   ??? Sexual activity: Not on file   Other Topics Concern   ??? Not on file   Social History Narrative   ??? Not on file       Family History: Family History   Problem Relation Age of Onset   ??? Sudden Cardiac Death Mother    ??? Heart Attack Mother    ??? Hypertension Brother        Pertinent Studies:       Echo Results  (Last 3 results in the past 3 years)    Echo EF LVIDD LA Size IVS LVPW Rest PAP    (09/28/15)  60 (08/03/15)  4.4 (08/03/15)  4.1 (08/03/15)  1.2 (08/03/15)  1.2 (08/03/15)  20    (08/03/15)  60               ECG (personal interpretation)   ECG dated today shows atrial paced rhythm with old inferior MI and acceptable QT interval.    Vitals:    09/18/18 1136   BP: 124/68   BP Source: Arm, Left Upper   Pulse: 59   SpO2: 98%   Weight: 91.8 kg (202 lb 6.4 oz)   Height: 1.829 m (6')   PainSc: Zero     Body mass index is 27.45 kg/m???.     Past Medical History  Patient Active Problem List    Diagnosis Date Noted   ??? Obstructive sleep apnea of adult 04/27/2018     HST done 02/21/2018 showed AHI of 19/hr mostly in supine position.     ??? 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 Pacemaker 02/08/2018     11/28/2017 - Dual Chamber PPM implant with SHS     ??? Sinus node dysfunction (HCC) 11/28/2017   ??? H/O thyroidectomy 11/23/2017   ??? On continuous oral anticoagulation 10/07/2015   ???  Hematoma of Uvula post TEE 10/05/2015     09/2015-on Triple anticoagulation     ??? Long term current use of amiodarone 09/29/2015     11/14: Initial (Pers) AF, amio 200/d-Dr Hannen  11/15 Decrease to 100/d  12/16 Pers AF-DC amio  7/17 Amio 200/d  3/18 Amio reduced to 1200/week     ??? 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 Persistent atrial fibrillation (HCC) 09/03/2015   ??? CAD S/P percutaneous coronary angioplasty 09/03/2015     07/05/2018 - Regadenoson MPI:  This study is borderline abnormal.  There is some partial evidence for slightly reversible uptake in the obtuse marginal circumflex distribution laterally.  Global left ventricular function at rest is mildly reduced but improves post stress.  The patient has a single 3 beat run of ventricular tachycardia during exercise without chest pain.  All segments are definitely viable.  Other high risk indicators are not noted.     ??? 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 Holter monitor, abnormal 08/31/2015   ??? Chronic fatigue 07/27/2015   ??? At risk for stroke 03/24/2015   ??? 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 Neck mass 02/20/2014   ??? Essential hypertension 07/14/2011   ??? 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 Coronary artery disease involving coronary bypass graft of native heart without angina pectoris 01/05/2009     Coronary artery disease, status post percutaneous coronary intervention of saphenous      vein graft to right coronary artery on 10/26/2007 at [facility].  Personal history of premature coronary artery disease -- The patient underwent his      first percutaneous coronary intervention at age 56  08/03/15 Echo: EF 60%. Mild MR. PAP 20 mmHg.   08/05/15 Bruce thallium: This study is abnormal due to calculated lower left ventricular ejection fraction of 42%. This is likely due to the patient's rapid ventricular rates noted with exercise stress. His peak heart rate achieved was 204 beats per minute which was 140% predicted heart rate for age. This is due to rapid ventricular response with atrial fibrillation. There is no definite significant inducible ischemia present. The patient did have dyspnea with physical activity and he was able to exercise for a maximal duration of 7 minutes 24 seconds. ???  09/03/15 LHC: Hemodynamically significant in-stent restenotic lesion in previously placed BMS to venous graft to RCA. Strongly positive fractional flow reserve assessment with reduction in FFR value from 0.94 to 0.72 within 45 seconds. Successful PCI of in-stent restenotic lesion in SVG to RCA with [id number] Herculink Elite stent with excellent angiographic results.      ??? Dyslipidemia 01/05/2009     Hyperlipidemia, on Crestor -- LDL is almost at goal.     ??? Restless leg syndrome 01/05/2009   ??? Family history of early CAD 01/05/2009     Family history of premature coronary artery disease -- The patient's brother died of      sudden cardiac death and another brother had coronary artery bypass graft at age 33.           Review of Systems   Constitution: Negative.   HENT: Negative.    Eyes: Negative.    Cardiovascular: Negative.    Respiratory: Negative.    Endocrine: Negative.  Hematologic/Lymphatic: Negative.    Skin: Negative.    Musculoskeletal: Negative.    Gastrointestinal: Negative.    Genitourinary: Negative.    Neurological: Positive for dizziness and headaches.   Psychiatric/Behavioral: Negative.    Allergic/Immunologic: Negative.    All other systems reviewed and are negative.      BP 124/68 (BP Source: Arm, Left Upper)  - Pulse 59  - Ht 1.829 m (6')  - Wt 91.8 kg (202 lb 6.4 oz)  - SpO2 98%  - BMI 27.45 kg/m???   GENERAL APPEARANCE: Patient in no apparent distress.  PSYCH: Appropriate  NEURO: Alert and conversant  VESSELS: JVD is not elevated above the sternal notch.  ENT: Moist mucous membranes  CARDIOVASCULAR: Healed sternotomy,  No parasternal lift Regular rhythm and normal rate. S1 and S2 present.  No murmur.  There is no pericardial rub  GI: Abdomen  nondistended No appreciable hepatomegaly in the upright position  RESPIRATORY: Clear breath sounds bilaterally  EXTREMITIES: There was no    lower extremity edema   SKIN: No rash, erythema, or drainage over left-sided chest device incision  GAIT: Able to ambulate to the exam table      Problems Addressed Today  Encounter Diagnoses   Name Primary?   ??? Persistent atrial fibrillation (HCC) Yes   ??? Nonsustained ventricular tachycardia (HCC) ??? Tachycardia-bradycardia (HCC)    ??? Essential hypertension    ??? S/P CABG x 2    ??? Sinus node dysfunction (HCC)    ??? CAD S/P percutaneous coronary angioplasty    ??? Pacemaker

## 2018-09-20 ENCOUNTER — Encounter: Admit: 2018-09-20 | Discharge: 2018-09-20

## 2018-09-20 DIAGNOSIS — I495 Sick sinus syndrome: Secondary | ICD-10-CM

## 2018-09-20 DIAGNOSIS — Z951 Presence of aortocoronary bypass graft: Secondary | ICD-10-CM

## 2018-09-20 DIAGNOSIS — E785 Hyperlipidemia, unspecified: Secondary | ICD-10-CM

## 2018-09-20 DIAGNOSIS — I10 Essential (primary) hypertension: Secondary | ICD-10-CM

## 2018-09-20 DIAGNOSIS — I4891 Unspecified atrial fibrillation: Secondary | ICD-10-CM

## 2018-09-20 DIAGNOSIS — I4819 Other persistent atrial fibrillation: Secondary | ICD-10-CM

## 2018-09-20 DIAGNOSIS — H9190 Unspecified hearing loss, unspecified ear: Secondary | ICD-10-CM

## 2018-09-20 DIAGNOSIS — I480 Paroxysmal atrial fibrillation: Secondary | ICD-10-CM

## 2018-09-20 DIAGNOSIS — H5440 Blindness, one eye, unspecified eye: Secondary | ICD-10-CM

## 2018-09-20 DIAGNOSIS — C4491 Basal cell carcinoma of skin, unspecified: Secondary | ICD-10-CM

## 2018-09-20 DIAGNOSIS — Z950 Presence of cardiac pacemaker: Secondary | ICD-10-CM

## 2018-09-20 DIAGNOSIS — I2510 Atherosclerotic heart disease of native coronary artery without angina pectoris: Secondary | ICD-10-CM

## 2018-09-20 DIAGNOSIS — I472 Ventricular tachycardia: Secondary | ICD-10-CM

## 2018-09-20 DIAGNOSIS — M1990 Unspecified osteoarthritis, unspecified site: Secondary | ICD-10-CM

## 2018-09-20 DIAGNOSIS — K219 Gastro-esophageal reflux disease without esophagitis: Secondary | ICD-10-CM

## 2018-09-20 DIAGNOSIS — C73 Malignant neoplasm of thyroid gland: Secondary | ICD-10-CM

## 2018-09-20 DIAGNOSIS — G2581 Restless legs syndrome: Secondary | ICD-10-CM

## 2018-09-20 DIAGNOSIS — J101 Influenza due to other identified influenza virus with other respiratory manifestations: Secondary | ICD-10-CM

## 2018-09-20 LAB — TSH WITH FREE T4 REFLEX: Lab: 2.6

## 2018-09-20 LAB — CBC
Lab: 245
Lab: 11 — ABNORMAL LOW (ref 14.0–18.0)
Lab: 13
Lab: 3.8 — ABNORMAL LOW (ref 4.70–6.10)
Lab: 30
Lab: 32
Lab: 8.1

## 2018-09-20 LAB — COMPREHENSIVE METABOLIC PANEL
Lab: 108 — ABNORMAL HIGH (ref 98–107)
Lab: 139
Lab: 24
Lab: 4.1
Lab: 76

## 2018-10-01 ENCOUNTER — Encounter: Admit: 2018-10-01 | Discharge: 2018-10-01

## 2018-10-01 NOTE — Telephone Encounter
10/01/2018 3:07 PM I called Mr Spradlin and offered him an appointment tomorrow with Ginger McIntosh-James ANP He accepted that and will be in tomorrow.  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 Gaynelle Cage, RN          Me   to [place], Tramel Westbrook           10/01/18 2:59 PM   Mr Granados I'm sorry your have problems.  I will call to see about getting you scheduled with Dr Virgina Organ. Thanks Gaynelle Cage, RN    This MyChart message has not been read.            10/01/18 2:55 PM   Grier Rocher, RN routed this conversation to [facility], Clayton Lefort   to Vertell Novak, MD           10/01/18 2:20 PM   Dr. Virgina Organ ,Since my last MAC visit on June 2nd in with Dr Karle Starch, I have been having non painful, uncomfortable symptoms multiple times daily.   And  NOW! It feels like a floppy feeling in my chest and kind of "breathless " for a minute.  It is hard to describe. Symptoms seems more pronounced when I use my arms or on on the stairs. At other times it happens at rest. I have been keeping  log of the worst ones. Last week when I rode my bike, I came home very  tired which is not my norm.I want an appt with you as soon as possible at any location. Thanks very much! Willaim Sheng.  DOB 06/16/1940.  [phone]  This encounter is not signed. The conversation may still be ongoing.   Additional Documentation     Encounter Info:   Billing Info,    History,    Allergies,    Detailed Report       Orders Placed      None   Medication Renewals and Changes        None      Medication List    Visit Diagnoses        None

## 2018-10-02 ENCOUNTER — Encounter: Admit: 2018-10-02 | Discharge: 2018-10-02

## 2018-10-02 ENCOUNTER — Ambulatory Visit: Admit: 2018-10-02 | Discharge: 2018-10-03

## 2018-10-02 DIAGNOSIS — G2581 Restless legs syndrome: Secondary | ICD-10-CM

## 2018-10-02 DIAGNOSIS — H5440 Blindness, one eye, unspecified eye: Secondary | ICD-10-CM

## 2018-10-02 DIAGNOSIS — I4891 Unspecified atrial fibrillation: Secondary | ICD-10-CM

## 2018-10-02 DIAGNOSIS — I2510 Atherosclerotic heart disease of native coronary artery without angina pectoris: Secondary | ICD-10-CM

## 2018-10-02 DIAGNOSIS — Z950 Presence of cardiac pacemaker: Secondary | ICD-10-CM

## 2018-10-02 DIAGNOSIS — I4819 Other persistent atrial fibrillation: Secondary | ICD-10-CM

## 2018-10-02 DIAGNOSIS — Z7901 Long term (current) use of anticoagulants: Secondary | ICD-10-CM

## 2018-10-02 DIAGNOSIS — I480 Paroxysmal atrial fibrillation: Secondary | ICD-10-CM

## 2018-10-02 DIAGNOSIS — I495 Sick sinus syndrome: Secondary | ICD-10-CM

## 2018-10-02 DIAGNOSIS — Z955 Presence of coronary angioplasty implant and graft: Secondary | ICD-10-CM

## 2018-10-02 DIAGNOSIS — I10 Essential (primary) hypertension: Secondary | ICD-10-CM

## 2018-10-02 DIAGNOSIS — C73 Malignant neoplasm of thyroid gland: Secondary | ICD-10-CM

## 2018-10-02 DIAGNOSIS — I491 Atrial premature depolarization: Secondary | ICD-10-CM

## 2018-10-02 DIAGNOSIS — R0609 Other forms of dyspnea: Secondary | ICD-10-CM

## 2018-10-02 DIAGNOSIS — K219 Gastro-esophageal reflux disease without esophagitis: Secondary | ICD-10-CM

## 2018-10-02 DIAGNOSIS — E785 Hyperlipidemia, unspecified: Secondary | ICD-10-CM

## 2018-10-02 DIAGNOSIS — J101 Influenza due to other identified influenza virus with other respiratory manifestations: Secondary | ICD-10-CM

## 2018-10-02 DIAGNOSIS — M1990 Unspecified osteoarthritis, unspecified site: Secondary | ICD-10-CM

## 2018-10-02 DIAGNOSIS — H9190 Unspecified hearing loss, unspecified ear: Secondary | ICD-10-CM

## 2018-10-02 DIAGNOSIS — Z9861 Coronary angioplasty status: Secondary | ICD-10-CM

## 2018-10-02 DIAGNOSIS — C4491 Basal cell carcinoma of skin, unspecified: Secondary | ICD-10-CM

## 2018-10-02 NOTE — Telephone Encounter
Maddux, Theodoro Grist, RN            He transmitted and I will complete report. No events noted.

## 2018-10-02 NOTE — Progress Notes
Date of Service: 10/02/2018    Kenneth Buchanan is a 78 y.o. male.       HPI     Mr. Kenneth Buchanan was seen in our office today for reevaluation of cardiac symptoms.  He is accompanied by his wife today.  He is a 78 year old male followed in our office by Dr. Nickolas Buchanan and also by Dr. Zollie Buchanan for his atrial arrhythmias.  He was previously followed by Dr. Clint Buchanan.  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  He was previously admitted at [facility] to undergo sotalol initiation, he was unable to tolerate this medication due to developing QT prolongation.  ZIO patch monitor in 07/2015 showed underlying atrial fibrillation 100% of the time with episodes of tachycardia and one 3-second pause.  On previous Holter and ILR monitoring he did have evidence of tachycardia/bradycardia syndrome and therefore he went pacemaker placement in August 2019. He had pulmonary function tests in January 2020 which were essentially normal    In March of this year he completed a treadmill MPI stress test which showed findings consistent with his known CAD history, LVEF 49%.  The tomographic pattern was mildly abnormal and it did show scar and ischemia in the inferior lateral wall compatible with his coronary anatomy.  He then saw Dr. Radford Buchanan in April and decrease his amiodarone to 200 mg daily and arranged for an updated echocardiogram in September at his 45-month follow-up visit.    He was last seen in our office on 09/18/2018 by Dr. Bernette Buchanan.  Laboratories include a CBC, CMP and TSH were ordered.  Patient was deemed to be at acceptable risk to hold his Xarelto for 2 days prior to potential left knee surgery without bridging.  He was recommended to continue on amiodarone 200 mg daily and Xarelto 15 mg daily  (using lower than usual dose due to interaction with amiodarone).  Dr. Bernette Buchanan discussed with the patient's options for management of his atrial fibrillation including AF ablation, long-term amiodarone, or chill future AV node ablation.  Was noted the patient had done a good job cutting back on his alcohol intake.  Plans were made for him to follow-up with EP APRN in 6 months and again with Dr. Bernette Buchanan in 1 year with device interrogation during that visit.    Today he reports complaints of having uncomfortable, but nonpainful, palpitations symptoms multiple times a day.  He tells me his symptoms started on June 8.  He reports that he is feeling a flip flopping sensation in his chest that lasts 1 to 2 seconds and gets  kind of breathless  breath for a minute or feels like he has to catch his breath.  His symptoms seem more pronounced when he uses his arms or goes  up and down stairs.  Sometimes his symptoms are occurring at rest.    He brought in a diary today which indicates that he has several episodes a day. Yesterday symptoms lasted  off and on all morning and he was not doing anything in particular.  He tells me there times when he has his palpitations that his chest feels kind of tight but denies pain.  His wife states that he was so concerned today about his symptoms that he brought a bag with him in case he needed to go to the hospital.  She voices some of the updates during the visit today.  He also notes that when he rides his bike and exercises he does not have any palpitations or chest symptoms.  He is able to do his exercise regimen of riding his mountain bike 5 times a week for 30 to 40 minutes with each session (about 7 to 8 miles), but tells me today that he seems to be more tired recently after his rides. He notes that he continues to have intermittent low energy which is difficult to predict as well as exertional dyspnea for several months now.  Wife mentions that they both are under some increased stress at home as their 37 year old daughter who is unstable is living with them.  Mr. Kenneth Buchanan has cut back on his alcohol intake but is drinking about 3 ounces of vodka 3 days a week. In April of this year his amiodarone was reduced to 200 mg daily. He has been on CPAP for 3 to 4 months.  He states he uses it about 7-8  hours a night and has found that he has been able to sleep better and has some improved energy throughout the day.  He denies any bleeding problems or unusual bruising.  He is not having PND, orthopnea, cough, peripheral edema, dizziness, near syncope or syncope.  Also denies fever, chills, night sweats or change in weight or appetite.  He denies [place] of tobacco.     We had the patient get a remote device check earlier today which shows normal device function.  No atrial events.  No ventricular events.  RV paced 15%.     Cardiovascular Studies  12 lead ECG today was reviewed by Dr. Geronimo Buchanan.  The ECG shows an atrial paced rhythm at 74 bpm.  QRS 84 ms, QT 452 ms, QTC 502 ms.  In comparison to the prior ECG dated 09/18/2018 there are no significant interval changes identified.    Assessment and Plan:  1.  Longstanding persistent atrial fibrillation. CHADS2VASC score of 4+ (age2, CAD, hypertension).  Has low normal EF of ~50% by thallium stress test in 06/2018.  His echocardiogram from 07/2015 showed EF of 60%, mild MR, LA 41 mm, PAP 20 mmHg.  He underwent direct cardioversion in June 2017 and again in August 2019.  He remains anticoagulated on Xarelto 15 mg daily (on lower than usual dose because of interaction with amiodarone). if he does come off amiodarone at some point in the future will need to increase Xarelto dose to 20 mg daily.  He is maintained on amiodarone 200 mg daily.  I assaulted with Dr. Geronimo Buchanan  in the office today with the following recommendations.  -No medication changes.  -2D echo Doppler.  -First available visit with EP provider.    -Patient given option of telehealth visit with Dr. Clint Buchanan tomorrow or wait until Dr. Bernette Buchanan is back in the [facility] clinic with opening on June 29.  Patient accepts telehealth visit with Dr. Clint Buchanan tomorrow.  Patient is familiar with telehealth visits and is set up for this on his home computer.    2.  Coronary artery disease status  post bypass surgery in 2003.  Status post PCI of the SVG to RCA in May 2018.  Treadmill MPI stress test in 06/2018 showed some abnormality consistent with his known coronary anatomy.  He not having angina symptoms.    3.  AV node disease and sinus node dysfunction.    4.  Permanent pacemaker.  Remote device check today shows normal device function.  No atrial and no ventricular events.  RV paced 15%.  Last in office device evaluation on 09/18/2018 showed normal device function.  No atrial events.  No ventricular events.  Trends stable.  Changes to programming: Increased RR to help with HR histogram.    5.  Hypertension.  Controlled. Blood pressure today 124/78.    6.  OSA, treated.    7.  Hyperlipidemia, treated.  -He will need an updated fasting lipid panel at next visit with Dr. Avie Arenas.    8.  Persistent stable exertional dyspnea.  PFTs performed in1/2020 emissary normal pulmonary function.    Follow Up: Follow-up with Dr. Clint Buchanan tomorrow, 6/16 with a telehealth visit for further evaluation and recommendation of palpitations.  Follow-up with Dr. Radford Buchanan in 4 months.  Follow-up with EP APRN in 5 to 6 months and Dr. Bernette Buchanan in 1 year.  Patient is encouraged to contact our office if he has problems prior to next visit.    I have educated the patient/family on plan of care.  Patient/family verbally expressed understanding and agreement with the plan.  Specific instructions are outlined in the after visit summary document.    Thank you for the opportunity to participate in the care of your patient.  If you have any questions or concerns please do not hesitate to contact me.        DISEASE PREVENTION   Lipids: Treated, on atorvastatin 80 mg daily   Lipid panel from 07/31/2017: Total cholesterol 129, triglycerides 76, HDL 37, LDL 81. Target LDL < 70, triglycercides < 135.      HTN: Controlled.  Blood pressure today is 124/78.     Diabetes: No.       Tobacco: Denies use.      Obesity: Body mass index is 26.9. Patient encouraged to continue to manage through diet and exercise.    Total time 55 minutes.  Estimated counseling time 40 minutes.  Counseled regarding: Medication instructions, treatment options, reviewed remote device check, diet, exercise, follow-up plan.                DRB  Vitals:    10/02/18 1321   BP: 124/78   BP Source: Arm, Right Upper   Pulse: 74   SpO2: 98%   Weight: 90.1 kg (198 lb 11.2 oz)   Height: 1.829 m (6')   PainSc: Zero     Body mass index is 26.95 kg/m???.     Past Medical History  Patient Active Problem List    Diagnosis Date Noted   ??? Obstructive sleep apnea of adult 04/27/2018     HST done 02/21/2018 showed AHI of 19/hr mostly in supine position.     ??? 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 Pacemaker 02/08/2018     11/28/2017 - Dual Chamber PPM implant with SHS     ??? Sinus node dysfunction (HCC) 11/28/2017   ??? H/O thyroidectomy 11/23/2017   ??? On continuous oral anticoagulation 10/07/2015   ??? Hematoma of Uvula post TEE 10/05/2015  09/2015-on Triple anticoagulation     ??? Long term current use of amiodarone 09/29/2015     11/14: Initial (Pers) AF, amio 200/d-Dr Hannen  11/15 Decrease to 100/d 12/16 Pers AF-DC amio  7/17 Amio 200/d  3/18 Amio reduced to 1200/week     ??? 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 Persistent atrial fibrillation (HCC) 09/03/2015   ??? CAD S/P percutaneous coronary angioplasty 09/03/2015     07/05/2018 - Regadenoson MPI:  This study is borderline abnormal.  There is some partial evidence for slightly reversible uptake in the obtuse marginal circumflex distribution laterally.  Global left ventricular function at rest is mildly reduced but improves post stress.  The patient has a single 3 beat run of ventricular tachycardia during exercise without chest pain.  All segments are definitely viable.  Other high risk indicators are not noted.     ??? 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 Holter monitor, abnormal 08/31/2015   ??? Chronic fatigue 07/27/2015   ??? At risk for stroke 03/24/2015   ??? 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 Neck mass 02/20/2014   ??? Essential hypertension 07/14/2011   ??? 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 Coronary artery disease involving coronary bypass graft of native heart without angina pectoris 01/05/2009     Coronary artery disease, status post percutaneous coronary intervention of saphenous      vein graft to right coronary artery on 10/26/2007 at [facility].  Personal history of premature coronary artery disease -- The patient underwent his      first percutaneous coronary intervention at age 3  08/03/15 Echo: EF 60%. Mild MR. PAP 20 mmHg.   08/05/15 Bruce thallium: This study is abnormal due to calculated lower left ventricular ejection fraction of 42%. This is likely due to the patient's rapid ventricular rates noted with exercise stress. His peak heart rate achieved was 204 beats per minute which was 140% predicted heart rate for age. This is due to rapid ventricular response with atrial fibrillation. There is no definite significant inducible ischemia present. The patient did have dyspnea with physical activity and he was able to exercise for a maximal duration of 7 minutes 24 seconds. ???  09/03/15 LHC: Hemodynamically significant in-stent restenotic lesion in previously placed BMS to venous graft to RCA. Strongly positive fractional flow reserve assessment with reduction in FFR value from 0.94 to 0.72 within 45 seconds. Successful PCI of in-stent restenotic lesion in SVG to RCA with [id number] Herculink Elite stent with excellent angiographic results.      ??? Dyslipidemia 01/05/2009     Hyperlipidemia, on Crestor -- LDL is almost at goal.     ??? Restless leg syndrome 01/05/2009   ??? Family history of early CAD 01/05/2009     Family history of premature coronary artery disease -- The patient's brother died of      sudden cardiac death and another brother had coronary artery bypass graft at age 47.           Review of Systems   Constitution: Positive for malaise/fatigue.   HENT: Negative.    Eyes: Negative.    Cardiovascular: Positive for dyspnea on exertion and palpitations.   Respiratory: Positive for shortness of breath.    Endocrine: Negative.  Hematologic/Lymphatic: Negative.    Skin: Negative.    Musculoskeletal: Negative.    Gastrointestinal: Negative.    Genitourinary: Negative.    Neurological: Negative.    Psychiatric/Behavioral: Negative.    Allergic/Immunologic: Negative.        Physical Exam   Vital signs were reviewed.   General Appearance:appears well nourished, appears relaxed, in  no acute distress   Skin: warm, moist, intact, no rash or ulcers  HEENT: unremarkable,  no scleral icterus  Lips & Mouth: no pallor or cyanosis  Neck Veins: neck veins are flat, neck veins are not distended   Carotid Arteries: normal carotid upstroke bilaterally, no bruits BL Chest Inspection: chest is normal in appearance, healed sternotomy, left deltopectoral groove incision well-healed with scar  Auscultation/Percussion/Effort: lungs clear to auscultation, no rales, rhonchi, or wheezing, respirations even and unlabored, no respiratory distress  Cardiac Rhythm: regular rhythm and normal rate   Cardiac Auscultation: Normal S1 & S2, no S3 or S4, no rub   Murmurs: no cardiac murmurs   Extremities: no lower extremity edema  BL; 2+ symmetric distal pulses   Abdominal Exam: soft, non-tender,non-distended, no obvious masses, bowel sounds normal, no guarding  Liver & Spleen: no organomegaly   Neurologic Exam: neurological assessment grossly intact, alert, moves all extremiteis  Orientation: oriented to time, person and place, clear historian  Gait:normal, steady, walks without assistance  Language & Memory: speech clear, patient responsive, seems to comprehend information  Psych: normal mood and affect, behavior is normal, judgment and thought content normal.         Problems Addressed Today  Encounter Diagnoses   Name Primary?   ??? Tachycardia-bradycardia (HCC) Yes   ??? Sinus node dysfunction (HCC)    ??? Persistent atrial fibrillation (HCC)    ??? Essential hypertension    ??? CAD S/P percutaneous coronary angioplasty    ??? Pacemaker                      Current Medications (including today's revisions)  ??? acetaminophen (TYLENOL) 500 mg tablet Take 1,000 mg by mouth as Needed for Pain.   ??? aflibercept(+) (EYLEA) 2 mg/0.05 mL intravitreal injection 2 mg by INTRAVITREAL route. Every 6 weeks   ??? amiodarone (CORDARONE) 200 mg tablet Take one tablet by mouth daily. Take with food.   ??? aspirin EC 81 mg tablet Take one tablet by mouth daily. Hold for 7 days, resume 8/27   ??? atorvastatin (LIPITOR) 80 mg tablet TAKE 1 TABLET BY MOUTH EVERY DAY   ??? levothyroxine (SYNTHROID) 150 mcg tablet Take one tablet by mouth daily 30 minutes before breakfast. Indications: a condition with low thyroid hormone levels ??? nitroglycerin (NITROSTAT) 0.4 mg tablet Place 1 tablet under tongue every 5 minutes as needed for Chest Pain. Max of 3 tablets, call 911.   ??? other medication 1 Dose. cbd   225mg  daily   ??? rivaroxaban (XARELTO) 15 mg tablet Take one tablet by mouth daily with dinner. Take with food. (Patient taking differently: Take 15 mg by mouth daily with breakfast. Take with food.)   ??? timolol XE(+) (TIMOPTIC-XR) 0.5 % ophthalmic gel Place 1 drop into or around eye(s) daily.

## 2018-10-03 ENCOUNTER — Encounter: Admit: 2018-10-03 | Discharge: 2018-10-03

## 2018-10-03 ENCOUNTER — Ambulatory Visit: Admit: 2018-10-03 | Discharge: 2018-10-04

## 2018-10-03 DIAGNOSIS — I4819 Other persistent atrial fibrillation: Secondary | ICD-10-CM

## 2018-10-03 DIAGNOSIS — I480 Paroxysmal atrial fibrillation: Secondary | ICD-10-CM

## 2018-10-03 DIAGNOSIS — Z9861 Coronary angioplasty status: Secondary | ICD-10-CM

## 2018-10-03 DIAGNOSIS — K219 Gastro-esophageal reflux disease without esophagitis: Secondary | ICD-10-CM

## 2018-10-03 DIAGNOSIS — I4811 Longstanding persistent atrial fibrillation: Secondary | ICD-10-CM

## 2018-10-03 DIAGNOSIS — G2581 Restless legs syndrome: Secondary | ICD-10-CM

## 2018-10-03 DIAGNOSIS — R079 Chest pain, unspecified: Secondary | ICD-10-CM

## 2018-10-03 DIAGNOSIS — I10 Essential (primary) hypertension: Secondary | ICD-10-CM

## 2018-10-03 DIAGNOSIS — C73 Malignant neoplasm of thyroid gland: Secondary | ICD-10-CM

## 2018-10-03 DIAGNOSIS — J101 Influenza due to other identified influenza virus with other respiratory manifestations: Secondary | ICD-10-CM

## 2018-10-03 DIAGNOSIS — Z8679 Personal history of other diseases of the circulatory system: Secondary | ICD-10-CM

## 2018-10-03 DIAGNOSIS — R002 Palpitations: Secondary | ICD-10-CM

## 2018-10-03 DIAGNOSIS — H5440 Blindness, one eye, unspecified eye: Secondary | ICD-10-CM

## 2018-10-03 DIAGNOSIS — M1990 Unspecified osteoarthritis, unspecified site: Secondary | ICD-10-CM

## 2018-10-03 DIAGNOSIS — H9190 Unspecified hearing loss, unspecified ear: Secondary | ICD-10-CM

## 2018-10-03 DIAGNOSIS — E785 Hyperlipidemia, unspecified: Secondary | ICD-10-CM

## 2018-10-03 DIAGNOSIS — G4733 Obstructive sleep apnea (adult) (pediatric): Secondary | ICD-10-CM

## 2018-10-03 DIAGNOSIS — Z950 Presence of cardiac pacemaker: Secondary | ICD-10-CM

## 2018-10-03 DIAGNOSIS — Z951 Presence of aortocoronary bypass graft: Secondary | ICD-10-CM

## 2018-10-03 DIAGNOSIS — I2510 Atherosclerotic heart disease of native coronary artery without angina pectoris: Secondary | ICD-10-CM

## 2018-10-03 DIAGNOSIS — Z79899 Other long term (current) drug therapy: Secondary | ICD-10-CM

## 2018-10-03 DIAGNOSIS — I495 Sick sinus syndrome: Secondary | ICD-10-CM

## 2018-10-03 DIAGNOSIS — E7849 Other hyperlipidemia: Secondary | ICD-10-CM

## 2018-10-03 DIAGNOSIS — I4891 Unspecified atrial fibrillation: Secondary | ICD-10-CM

## 2018-10-03 DIAGNOSIS — Z1159 Encounter for screening for other viral diseases: Secondary | ICD-10-CM

## 2018-10-03 DIAGNOSIS — C4491 Basal cell carcinoma of skin, unspecified: Secondary | ICD-10-CM

## 2018-10-03 IMAGING — MR L-spine^LUMBAR BLOCK
5 series · 34 of 48 positions shown · non-contrast
Comparison: none

[Series 2: T2 · sagittal · 4.0mm · 0.62mm/px · 6 of 13 slices shown (1 of 2)]
[im 1/13]
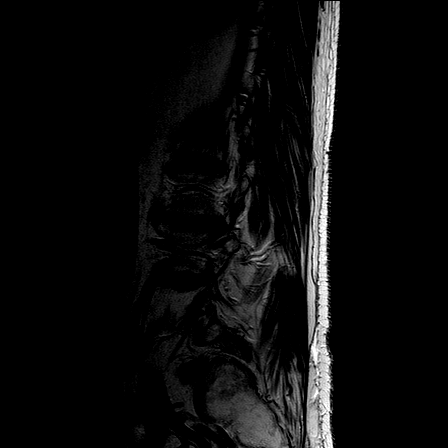
[im 3/13]
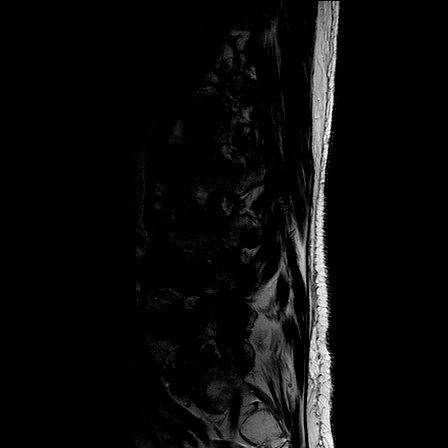
[im 5/13]
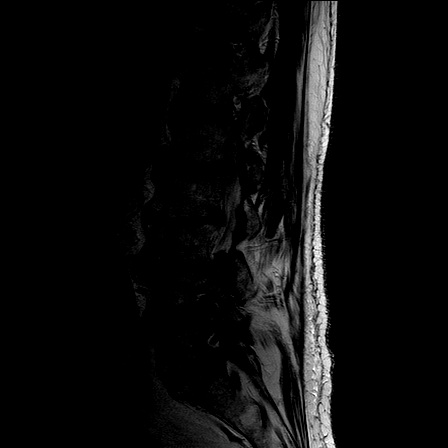
[im 8/13]
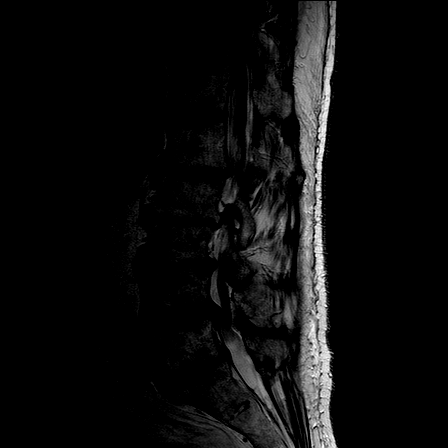
[im 10/13]
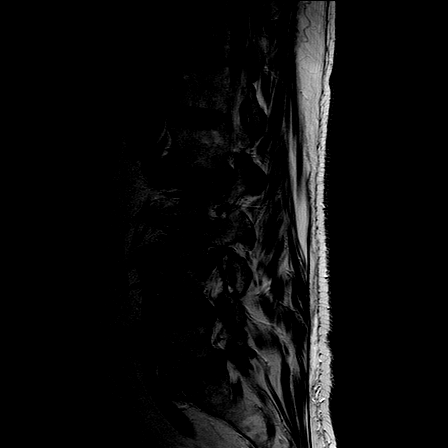
[im 13/13]
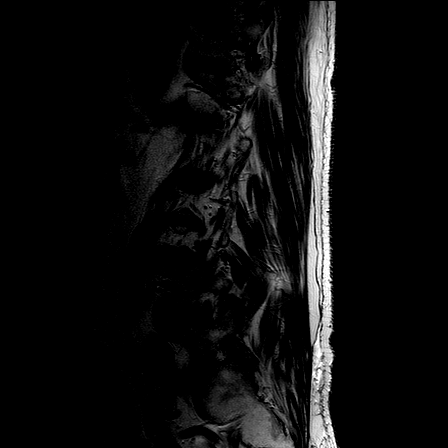

[Series 3: T1 · sagittal · 4.0mm · 0.73mm/px · 6 of 13 slices shown (1 of 2)]
[im 1/13]
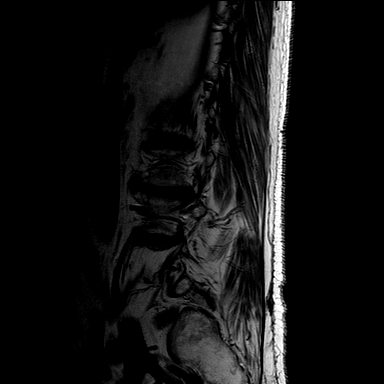
[im 3/13]
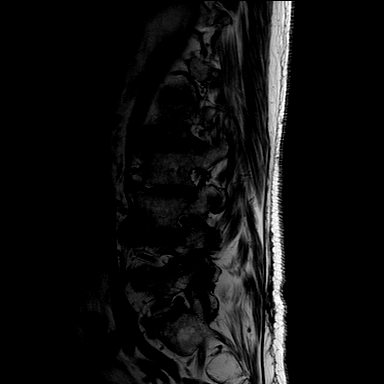
[im 5/13]
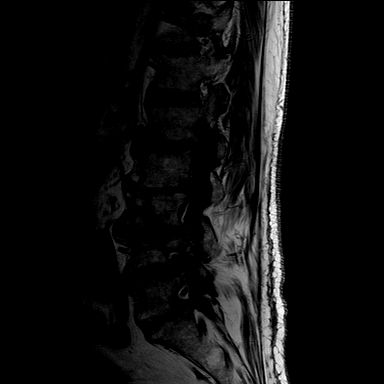
[im 8/13]
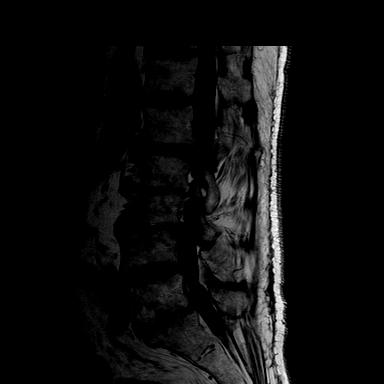
[im 10/13]
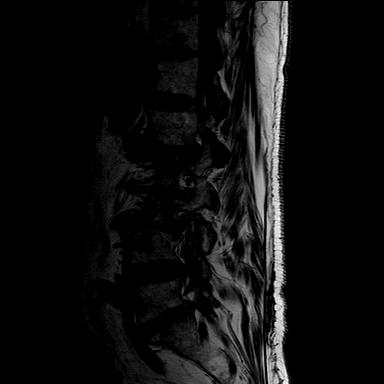
[im 13/13]
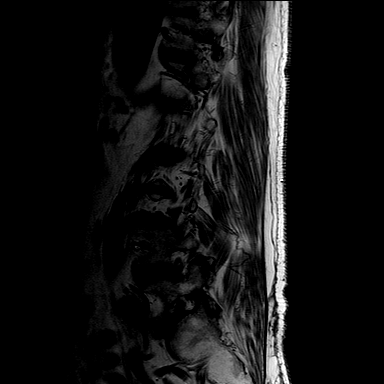

[Series 4: STIR · sagittal · 4.0mm · 0.55mm/px · 4 of 13 slices shown]
[im 1/13]
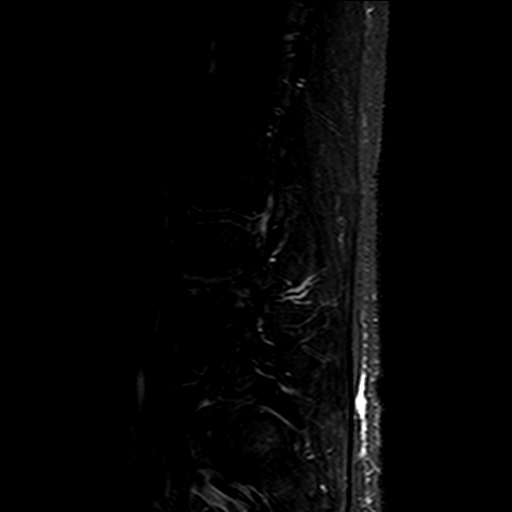
[im 3/13]
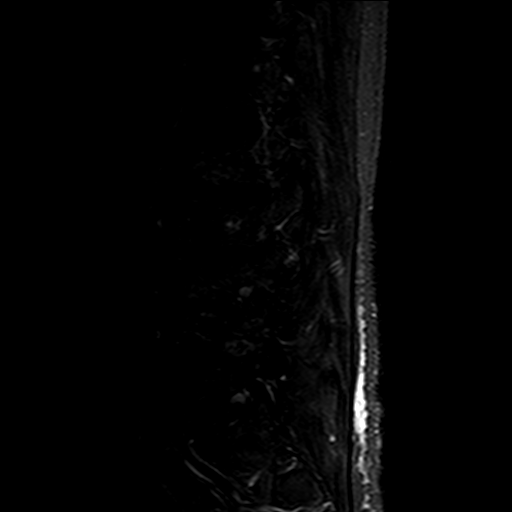
[im 5/13]
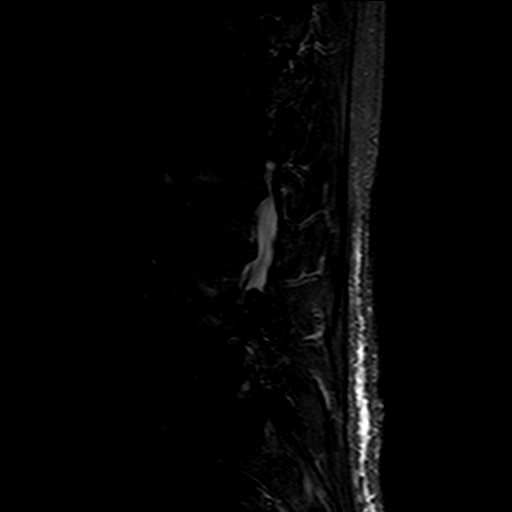
[im 8/13]
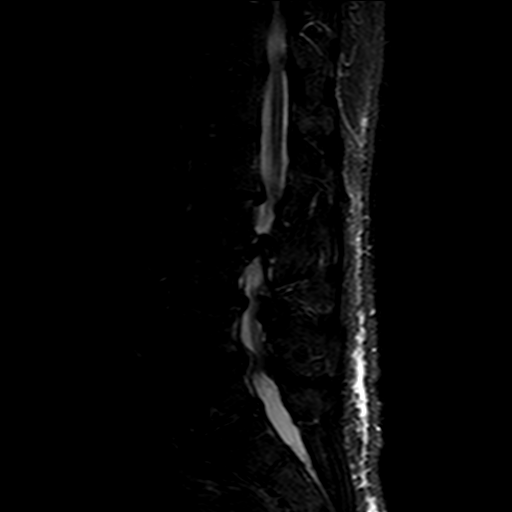

[Series 5: T2 · axial · 4.5mm · 0.49mm/px · z∈[-131,+46]mm · 9 of 30 slices shown (2 of 2)]
[im 1/30]
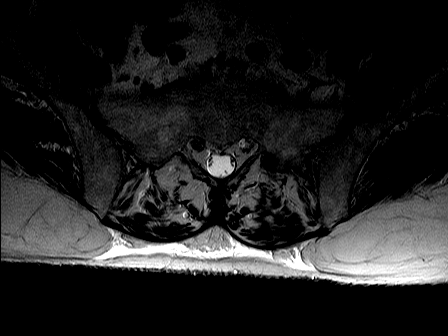
[im 5/30]
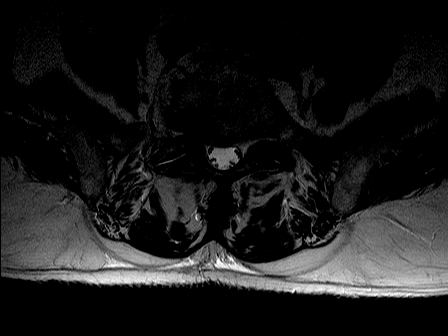
[im 9/30]
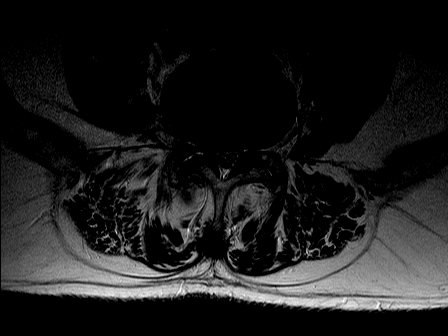
[im 13/30]
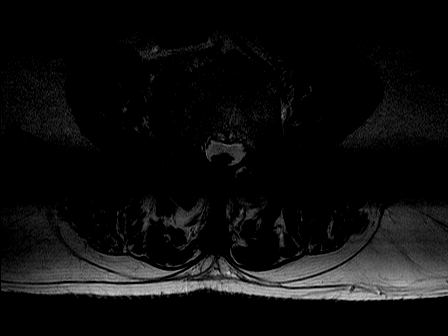
[im 15/30]
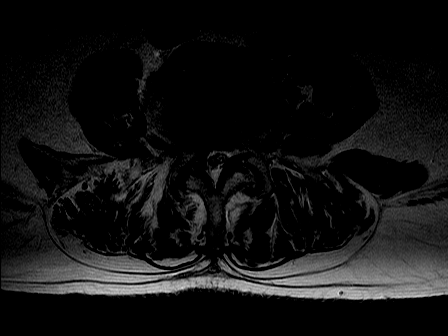
[im 17/30]
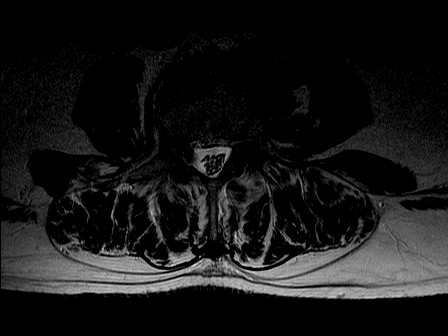
[im 21/30]
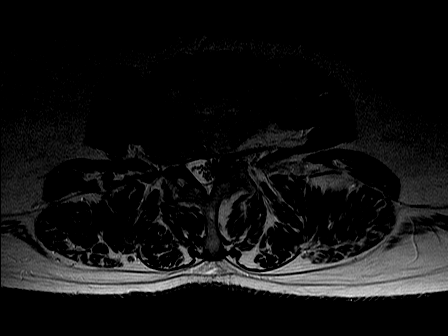
[im 25/30]
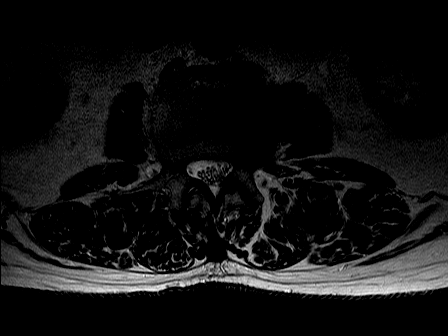
[im 30/30]
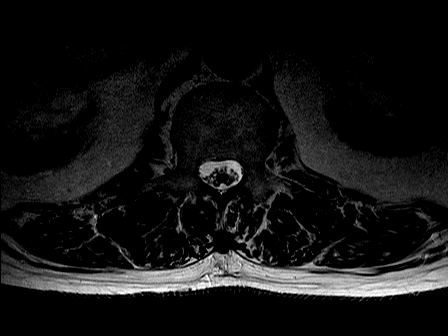

[Series 6: T1 · axial · 4.5mm · 0.86mm/px · z∈[-131,+46]mm · 9 of 30 slices shown (2 of 2)]
[im 1/30]
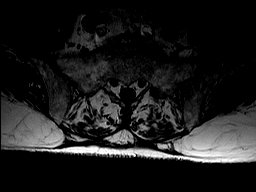
[im 5/30]
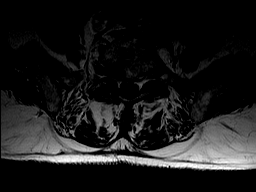
[im 9/30]
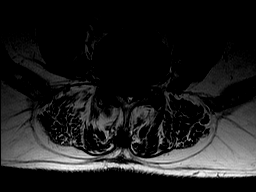
[im 13/30]
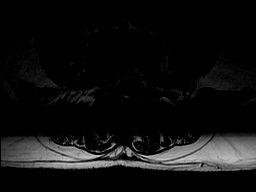
[im 15/30]
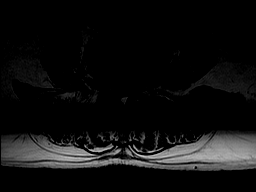
[im 17/30]
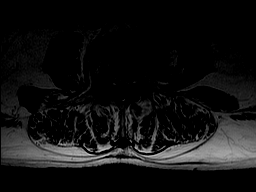
[im 21/30]
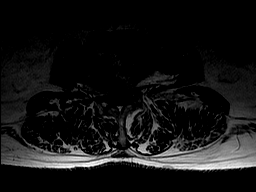
[im 25/30]
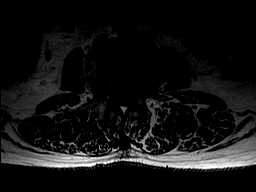
[im 30/30]
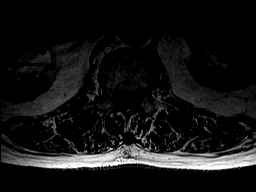

[34 of 48 positions shown; findings below may reference images not displayed]

MRI REPORT

EXAM

MRI lumbar spine without contrast.

INDICATION

low back pain
LOW BACK PAIN X 2-3 WEEKS WITH SHOOTING PAIN TO RIGHT LEG.

FINDINGS

Prior radiograph the lumbar spine was reviewed from 05/17/2017.

Sagittal T1, T2 and stir and axial T1 and T2 weighted images of the lumbar spine were obtained.

At L1-2, there is disc space narrowing. There are osteophytes of the vertebral body endplates.
There is narrowing of the facet joints bilaterally with osteophytes.

At L2-3, there is pronounced disc space narrowing. There are osteophytes of vertebral body
endplates. There is narrowing of the facet joints with osteophytes. There is moderate neural
foraminal stenosis on the left and mild neural foraminal stenosis on the right.

At L3-4, there is disc space narrowing with degenerative changes. There are prominent osteophytes
of the vertebral body endplates. There is pronounced neural foraminal stenosis bilaterally due to
osteophytes. There is facet joint space narrowing with osteophytes. There is ligamentum flavum
hypertrophy.

At L4-5, there is disc space narrowing. There is a small broad-based disc protrusion. There are
osteophytes of the vertebral body endplates and facet joints.There is pronounced neural foraminal
stenosis on the right due to osteophytes.

At L5-S1, there is disc space narrowing. There are osteophytes of vertebral body endplates. There
is narrowing of the facet joints. There is moderate neural foraminal stenosis bilaterally.

IMPRESSION

There is moderately pronounced multilevel lumbar spine spondylosis as described. No acute
appearing abnormality is identified.

## 2018-10-03 MED ORDER — ASPIRIN 81 MG PO CHEW
324 mg | Freq: Once | ORAL | 0 refills | Status: CN
Start: 2018-10-03 — End: ?

## 2018-10-03 NOTE — Telephone Encounter
Spoke with patient who denies any symptoms at this current time. He states that he has symptoms that he described at his telehealth visit off and on today. He would like to proceed with cardiac catheterization.   Will discuss with LDB and schedule first available cath.

## 2018-10-03 NOTE — Telephone Encounter
Per LDB patient to arrange for heart cath this week.   Spoke with Dr. Katrina Stack and no openings on Friday.   Patient scheduled for cath 10/08/18 and he is agreeable.

## 2018-10-03 NOTE — Telephone Encounter
-----   Message from Richard Miu, RN sent at 10/03/2018  4:00 PM CDT -----  Regarding: FW: Visit Follow-Up Question  Contact: [phone]    ----- Message -----  From: [name]  Sent: 10/03/2018   3:09 PM CDT  To: [facility] Nurse Triage [facility] [place]  Subject: Visit Follow-Up Question                         Dr Artist Beach,  Since our appt via Zoom this a.m. I have be feeling not well and have been having the symptoms we discussed. I am very concerned.  My request is to receive a call from you or Dr Virgina Organ  today to discuss going the route of an arteriogram  or coming to  the [facility] office where  the cardiac monitoring  would start  ASAP.  I do not want to wait until the [place] office can do it. I want to get this over with!Thank you!   Kenneth Buchanan. DOB 10/25/1940.  [phone].

## 2018-10-04 ENCOUNTER — Encounter: Admit: 2018-10-04 | Discharge: 2018-10-04

## 2018-10-04 DIAGNOSIS — I25810 Atherosclerosis of coronary artery bypass graft(s) without angina pectoris: Secondary | ICD-10-CM

## 2018-10-04 DIAGNOSIS — I4819 Other persistent atrial fibrillation: Secondary | ICD-10-CM

## 2018-10-04 DIAGNOSIS — I10 Essential (primary) hypertension: Secondary | ICD-10-CM

## 2018-10-04 DIAGNOSIS — Z951 Presence of aortocoronary bypass graft: Secondary | ICD-10-CM

## 2018-10-04 DIAGNOSIS — R0789 Other chest pain: Principal | ICD-10-CM

## 2018-10-04 DIAGNOSIS — I2510 Atherosclerotic heart disease of native coronary artery without angina pectoris: Secondary | ICD-10-CM

## 2018-10-04 DIAGNOSIS — R079 Chest pain, unspecified: Secondary | ICD-10-CM

## 2018-10-04 DIAGNOSIS — R0609 Other forms of dyspnea: Secondary | ICD-10-CM

## 2018-10-04 DIAGNOSIS — Z7901 Long term (current) use of anticoagulants: Secondary | ICD-10-CM

## 2018-10-04 LAB — BASIC METABOLIC PANEL
Lab: 1.1
Lab: 108 — ABNORMAL HIGH (ref 98–107)
Lab: 140 — ABNORMAL HIGH (ref 37–31.0)
Lab: 18
Lab: 27
Lab: 4.2
Lab: 67
Lab: 8.3 — ABNORMAL LOW (ref 8.8–10)
Lab: 95

## 2018-10-04 LAB — CBC
Lab: 12 — ABNORMAL LOW (ref 14.0–52.0)
Lab: 3.9 — ABNORMAL LOW (ref 4.70–6.10)
Lab: 37 — ABNORMAL LOW (ref 42.0–52.0)
Lab: 6.4
Lab: 96

## 2018-10-04 LAB — MAGNESIUM: Lab: 2.2

## 2018-10-04 NOTE — H&P (View-Only)
Patient presents for procedure. Please see History and Physical copied below from date of service      Dorena Cookey, APRN  Pager [phone]     Miles Costain, MD   Clinical Cardiac Electrophysiology, Internal Medicine   Chest discomfort +12 more   Dx   Irregular Heart Beat   ; Referred by Lona Kettle, MD   Reason for Visit    Progress Notes   Miles Costain, MD (Physician) ??? ??? Clinical Cardiac Electrophysiology, Internal Medicine ??? ??? 10/03/18 0900 ??? ??? Addendum      Date of Service: 10/03/2018  ???  Tyreek Kennemer Shibuya is a 78 y.o. male.     ???  ADDENDUM: 1700 he ended up having a bad day today with a lot of symptoms and wants to proceed with cath as soon as possible.  We will try and get him in this week but it may be Monday.  If things progress he will come to the emergency room.  ???  Obtained patient's, or patient proxy's, verbal consent to treat them and their agreement to [facility] financial policy and NPP via this telehealth visit during the [employer] Emergency  ???  HPI  Grenville Matison was evaluated by [employer] today.  I saw him last on November 16, 2017.  At that time I recommended a permanent pacemaker which which was placed by Dr. [name].  He has followed up with Dr. [name] since that time.  ???  There is a history of chronic persistent A. fib and a chads vas score of 4.  He has hypertension and hyperlipidemia.  He had bypass in 2003.  In 2009 with a vein graft to his RCA was dilated and stented.  In 2017 he had in-stent restenosis in that same location and he had another angioplasty and stent.  ???  In June 2017 while on triple anticoagulation he underwent TEE guided cardioversion and ended up with a big hematoma of the uvula.  He got 1 dose of Tikosyn and had impressive QT prolongation.  He was placed on amiodarone and ultimately titrated to 1800 mg weekly.  I did not sort out exactly when but at some point his dose was increased to 2100 mg weekly.  ??? 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  ???  He had a thallium study in March 2020 that was low risk for ischemia.  At that point he had his amiodarone reduced to 200 mg daily.  ???  He saw Dr. [name] at the beginning of June and things were stable.  He was not having any atrial tacky arrhythmias.  ???  He saw 1 of our APP's yesterday because of a new symptom that began about 1 week ago.  We do have a remote from yesterday and his device is still not seeing any atrial tachyarrhythmias that meet mode switch criteria.  He is A pacing about three quarters of the time and he V paces about 17% of the time.  About a week ago he developed a funny sinking in his chest several times a day.  This was mostly when he was up and around but it could occur when he was sitting.  In general he would sit down and and it would abate.  On a couple of occasions he felt like his heart might be beating fast when this happened.  Sometimes he would feel like he needed to take a deep breath  or cough when this happens.  It was more common in the morning than later in the day.  ???  Typically he rides his bike but he is not felt confident in how he would do on his bike if he developed this symptom complex and he has been more sedentary the last few  ???  His previous angina was what he described as a funny sick feeling in the low chest and epigastrium.  He says this is different but he also thinks that this is blockage and he thinks that stress tests have not been very accurate on him in the past.  ???  He notes that he has not had this symptom at all today.  (Our video visit started at 9 AM).   ???  He denies PND, orthopnea and edema.  He denies syncope.  It is not a presyncopal sensation that he is having.  ???  ???      Vitals:   ??? 10/03/18 0853   Weight: 89.8 kg (198 lb)   Height: 1.829 m (6')   ???  Body mass index is 26.85 kg/m???.   ???  Past Medical History        Patient Active Problem List ??? Diagnosis Date Noted   ??? Obstructive sleep apnea of adult 04/27/2018   ??? ??? HST done 02/21/2018 showed AHI of 19/hr mostly in supine position.  ???   ??? 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 ??? 11/28/2017 - Dual Chamber PPM implant with SHS  ???   ??? Sinus node dysfunction (HCC) 11/28/2017   ??? H/O thyroidectomy 11/23/2017   ??? On continuous oral anticoagulation 10/07/2015   ??? Hematoma of Uvula post TEE 10/05/2015   ??? ??? 09/2015-on Triple anticoagulation  ???   ??? Long term current use of amiodarone 09/29/2015   ??? ??? 11/14: Initial (Pers) AF, amio 200/d-Dr Hannen  11/15 Decrease to 100/d  12/16 Pers AF-DC amio  7/17 Amio 200/d  3/18 Amio reduced to 1200/week  ???   ??? 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 Persistent atrial fibrillation (HCC) 09/03/2015   ??? CAD S/P percutaneous coronary angioplasty 09/03/2015   ??? ??? 07/05/2018 - Regadenoson MPI:  This study is borderline abnormal. ???There is some partial evidence for slightly reversible uptake in the obtuse marginal circumflex distribution laterally. ???Global left ventricular function at rest is mildly reduced but improves post stress. ???The patient has a single 3 beat run of ventricular tachycardia during exercise without chest pain. ???All segments are definitely viable. ???Other high risk indicators are not noted.  ???   ??? 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 Holter monitor, abnormal 08/31/2015   ??? Chronic fatigue 07/27/2015   ??? At risk for stroke 03/24/2015   ??? 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 Neck mass 02/20/2014   ??? Essential hypertension 07/14/2011   ??? 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 Detached retina, right 01/14/2010   ??? ??? July 2011  ???   ??? 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 ??? Coronary artery disease, status post percutaneous coronary intervention of saphenous      vein graft to right coronary artery on 10/26/2007 at [facility].  Personal history of  premature coronary artery disease -- The patient underwent his      first percutaneous coronary intervention at age 75  08/03/15 Echo: EF 60%. Mild MR. PAP 20 mmHg.   08/05/15 Bruce thallium: This study is abnormal due to calculated lower left ventricular ejection fraction of 42%. This is likely due to the patient's rapid ventricular rates noted with exercise stress. His peak heart rate achieved was 204 beats per minute which was 140% predicted heart rate for age. This is due to rapid ventricular response with atrial fibrillation. There is no definite significant inducible ischemia present. The patient did have dyspnea with physical activity and he was able to exercise for a maximal duration of 7 minutes 24 seconds. ???  09/03/15 LHC: Hemodynamically significant in-stent restenotic lesion in previously placed BMS to venous graft to RCA. Strongly positive fractional flow reserve assessment with reduction in FFR value from 0.94 to 0.72 within 45 seconds. Successful PCI of in-stent restenotic lesion in SVG to RCA with [id number] Herculink Elite stent with excellent angiographic results.   ???   ??? Dyslipidemia 01/05/2009   ??? ??? Hyperlipidemia, on Crestor -- LDL is almost at goal.  ???   ??? Restless leg syndrome 01/05/2009   ??? Family history of early CAD 01/05/2009   ??? ??? Family history of premature coronary artery disease -- The patient's brother died of      sudden cardiac death and another brother had coronary artery bypass graft at age 33.  ???   ???  ???  ???  Review of Systems   Constitution: Negative.   HENT: Negative.    Eyes: Negative.    Cardiovascular: Positive for dyspnea on exertion, irregular heartbeat and palpitations.   Respiratory: Positive for shortness of breath and wheezing.    Endocrine: Negative. Hematologic/Lymphatic: Negative.    Skin: Negative.    Musculoskeletal: Negative.    Gastrointestinal: Negative.    Genitourinary: Negative.    Neurological: Negative.    Psychiatric/Behavioral: Negative.    Allergic/Immunologic: Negative.    ???  ???  Physical Exam  He appears well and is not short of breath  ???  Cardiovascular Studies  I reviewed yesterday's remote  His labs are up-to-date as of June 4.  His hemoglobin was 11.8 with normal indices.  His potassium was 4.1 and creatinine was 1.01.  His TSH was 2.68 and liver function studies were normal.  His last ejection fraction was in March and it was 49% which is stable for him.  His partial evidence of slightly reversible uptake in the obtuse marginal distribution.  ???  Problems Addressed Today       Encounter Diagnoses   Name Primary?   ??? Chest discomfort Yes   ??? Palpitation ???   ??? Tachycardia-bradycardia (HCC) ???   ??? Sinus node dysfunction (HCC) ???   ??? S/P CABG x 2 ???   ??? Persistent atrial fibrillation (HCC) ???   ??? Essential hypertension ???   ??? CAD S/P percutaneous coronary angioplasty ???   ??? Pacemaker ???   ??? On continuous oral anticoagulation ???   ??? Long term current use of amiodarone ???   ??? Dyslipidemia ???   ??? 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   ???  ???  Assessment and Plan  He thinks this is angina although it is different than his previous angina  ???  As an EP my bias is that it is somehow rhythm related note the fact  that amiodarone was reduced 3 months ago so this would be a timeframe for a change in rhythm as he equilibrates on the lower dose of amiodarone.  His device has not detected mode switches in the last week but there could be other changes in the rhythm/pacing that she had from a routine device interrogation.  This could be more frequent APDs or short runs of atrial tach.  It could also be a change in his ventricular pacing.  He told me that on a couple of occasions in the past when his device was being evaluated and changes will be made that he had similar sensations.  I am wondering if he could lose AV synchrony or have more V pacing etc.  ???  We are going to do a Holter monitor for at least a couple of days.  I told him that I want to see several of these episodes which she needs to document on his diary.  If this happens in the first couple of days great and if not he will need to wear the device a few days longer until we do capture episodes.  If the Holter demonstrates something that looks like a culprit then we will try to address it through programming or medication.  If the Holter does not shed any light on what is going on then we will do an angiogram.  ???  I offered him the choice of proceeding directly to cardiac cath with coronary and graft visualization and possible angioplasty.  Since that is invasive and the Holter is not he agreed to do the Holter first.  However, if there is a change in his symptoms that suggests progression or makes him more convinced that this is angina then he will contact us promptly and we will arrange prompt admission for invasive coronary evaluation.  ???  Zoom video visit, 28 minutes  ???  ???  Current Medications (including today's revisions)  ??? acetaminophen (TYLENOL) 500 mg tablet Take 1,000 mg by mouth as Needed for Pain.   ??? aflibercept(+) (EYLEA) 2 mg/0.05 mL intravitreal injection 2 mg by INTRAVITREAL route. Every 6 weeks   ??? amiodarone (CORDARONE) 200 mg tablet Take one tablet by mouth daily. Take with food.   ??? aspirin EC 81 mg tablet Take one tablet by mouth daily. Hold for 7 days, resume 8/27   ??? atorvastatin (LIPITOR) 80 mg tablet TAKE 1 TABLET BY MOUTH EVERY DAY   ??? levothyroxine (SYNTHROID) 150 mcg tablet Take one tablet by mouth daily 30 minutes before breakfast. Indications: a condition with low thyroid hormone levels   ??? nitroglycerin (NITROSTAT) 0.4 mg tablet Place 1 tablet under tongue every 5 minutes as needed for Chest Pain. Max of 3 tablets, call 911.   ??? other medication 1 Dose. cbd   225mg  daily   ??? rivaroxaban (XARELTO) 15 mg tablet Take one tablet by mouth daily with dinner. Take with food. (Patient taking differently: Take 15 mg by mouth daily with breakfast. Take with food.)   ??? timolol XE(+) (TIMOPTIC-XR) 0.5 % ophthalmic gel Place 1 drop into or around eye(s) daily.   ???  ???        Allergies   Allergen Reactions   ??? Valium [Diazepam] UNKNOWN and SEE COMMENTS     During a procedure something happened after getting IV Valium. Pt had to receive a shot b/c of it.     Social History     Socioeconomic History   ??? Marital status: Married  Spouse name: Okey Regal   ??? Number of children: 5   ??? Years of education: Not on file   ??? Highest education level: Not on file   Occupational History     Employer: RETIRED   Tobacco Use   ??? Smoking status: Former Smoker     Packs/day: 3.50     Years: 20.00     Pack years: 70.00     Types: Cigarettes     Last attempt to quit: 04/16/1982     Years since quitting: 36.4   ??? Smokeless tobacco: Never Used   Substance and Sexual Activity   ??? Alcohol use: Yes     Comment: 2 drinks per week    ??? Drug use: No   ??? Sexual activity: Not on file   Other Topics Concern   ??? Not on file   Social History Narrative   ??? Not on file     Surgical History:   Procedure Laterality Date   ??? COLONOSCOPY  1980   ??? CORONARY ARTERY BYPASS GRAFT  1992    CABG times 2   ??? CORONARY STENT PLACEMENT  2010    stent times 1   ??? RETINAL DETACHMENT REPAIR  2012    times 3   ??? CARDIOVERSION  2013   ??? CYSTECTOMY  04/2014    thyroid   ??? THYROIDECTOMY Bilateral 08/06/2014    Performed by Adonis Housekeeper, MD at [facility] OR   ??? DIRECT LARYNGOSCOPY N/A 08/06/2014    Performed by Adonis Housekeeper, MD at [facility] OR   ??? RECURRENT LARYNGEAL NERVE MONITORING N/A 08/06/2014    Performed by Adonis Housekeeper, MD at [facility] OR   ??? FLEXIBLE FIBEROPTIC LARYNGOSCOPY N/A 08/06/2014    Performed by Adonis Housekeeper, MD at [facility] OR ??? CENTRAL NECK DISSECTION N/A 08/06/2014    Performed by Adonis Housekeeper, MD at [facility] OR   ??? Left Heart Catheterization With Ventriculogram Left 09/03/2015    Performed by Harley Alto, MD at [facility] CATH LAB   ??? Coronary Angiography N/A 09/03/2015    Performed by Harley Alto, MD at [facility] CATH LAB   ??? Possible Percutaneous Coronary Intervention N/A 09/03/2015    Performed by Harley Alto, MD at [facility] CATH LAB   ??? PACEMAKER INSERTION  11/28/2017    [place]   ??? INSERTION/ REPLACEMENT PERMANENT PACEMAKER WITH ATRIAL AND VENTRICULAR LEAD Left 11/28/2017    Performed by [name], MD at [facility] CVOR   ??? REMOVAL CARDIAC EVENT RECORDER Left 11/28/2017    Performed by [name], MD at [facility] CVOR   ??? EXTERNAL CARDIOVERSION N/A 11/28/2017    Performed by [name], MD at [facility] CVOR     Family History   Problem Relation Age of Onset   ??? Sudden Cardiac Death Mother    ??? Heart Attack Mother    ??? Hypertension Brother

## 2018-10-04 NOTE — Telephone Encounter
-----   Message from Louis Meckel, LPN sent at 05/28/3126 10:08 AM CDT -----  Regarding: LDB- procedure ?  VM from wife [phone] on triage line.  Said that he has procedure tomorrow and they need to know if she gets to come in the day of procedure to see him?  If yes, does she need the covid test too?  Call him at [phone] or her.

## 2018-10-04 NOTE — Progress Notes
Medicare is listed as patient's primary insurance coverage.  Pre-certification is not required for hospitalizations.

## 2018-10-04 NOTE — Telephone Encounter
I called patient's wife back to discuss her questions.  I let her know that she will need to wait in the waiting room and will not be able to come back to his room.  Patient's wife is agreeable with no further concerns or questions at this time.

## 2018-10-05 ENCOUNTER — Encounter: Admit: 2018-10-05 | Discharge: 2018-10-05

## 2018-10-05 DIAGNOSIS — R079 Chest pain, unspecified: Secondary | ICD-10-CM

## 2018-10-05 DIAGNOSIS — I4819 Other persistent atrial fibrillation: Secondary | ICD-10-CM

## 2018-10-05 NOTE — Progress Notes
Cardiovascular Labs Scheduling Checklist   1. Date of procedure:06-22    2. Arrival time:0830    3. Patient instructed NPO after MN; clear liquids until: 0730    4. Instructed to check-in at the heart hospital registration desk and bring photo id, insurance cards and a current list of home medications.  Pack an overnight bag in the event you are admitted overnight:Y     5. If you have a history of sleep apnea and use a C-Pap or Bi-Pap machine, please bring it with you to the hospital:Y    6. Have a driver available upon discharge as you may not be cleared to drive for 48 hours or more post procedure. (exception is RHC/biopsy patient with internal jugular approach not receiving sedation): Y    7. If the patient's language preference is other than English, please indicated native language and need for interpreter:  N    8. Patient instructed to drink 64 oz water the day before their procedure if applicable and not contraindicated: Y    9. Patient instructed no caffeine for 24 hours before procedure:Y     10. Pre-procedure medications reviewed: Y  Hold the following Medications: Continue to take the following:                     11. Contrast allergy with need for contrast allergy prophylaxis:     12. Patient instructed to bath with antibacterial soap or surgical scrub:Y    13. List all same day pre-procedure requirements, i.e. Labs/H&P/EKG:    14. List any same day pre-procedure appointments:N    15. List any isolation precautions and organism:N    16. List any special considerations: N    17. Patient instructed to prepare for delays as there may be urgent or emergent cases: Y    18. Patient acknowledges understanding of pre-procedure instructions: Kenneth Buchanan

## 2018-10-06 ENCOUNTER — Encounter: Admit: 2018-10-06 | Discharge: 2018-10-06

## 2018-10-06 ENCOUNTER — Ambulatory Visit: Admit: 2018-10-06 | Discharge: 2018-10-07

## 2018-10-06 DIAGNOSIS — Z01818 Encounter for other preprocedural examination: Secondary | ICD-10-CM

## 2018-10-06 DIAGNOSIS — Z1159 Encounter for screening for other viral diseases: Secondary | ICD-10-CM

## 2018-10-06 NOTE — Progress Notes
Patient arrived to [facility] for COVID-19 testing 10/06/18 1613. Patient identity confirmed via photo I.D. Nasopharyngeal procedure explained to the patient.   Nasopharyngeal swab completed left  Patient education provided given and instructed patient self isolate until contacted w/ results and further instructions.   Swab collected by Ladona Ridgel, RN.    Date symptoms began/reason for testing: pre op

## 2018-10-07 ENCOUNTER — Encounter: Admit: 2018-10-07 | Discharge: 2018-10-07

## 2018-10-07 LAB — COVID-19 (SARS-COV-2) PCR

## 2018-10-08 ENCOUNTER — Encounter: Admit: 2018-10-08 | Discharge: 2018-10-08

## 2018-10-08 ENCOUNTER — Ambulatory Visit: Admit: 2018-10-08 | Discharge: 2018-10-08

## 2018-10-08 DIAGNOSIS — G2581 Restless legs syndrome: Secondary | ICD-10-CM

## 2018-10-08 DIAGNOSIS — I2510 Atherosclerotic heart disease of native coronary artery without angina pectoris: Secondary | ICD-10-CM

## 2018-10-08 DIAGNOSIS — I4819 Other persistent atrial fibrillation: Secondary | ICD-10-CM

## 2018-10-08 DIAGNOSIS — M1990 Unspecified osteoarthritis, unspecified site: Secondary | ICD-10-CM

## 2018-10-08 DIAGNOSIS — K219 Gastro-esophageal reflux disease without esophagitis: Secondary | ICD-10-CM

## 2018-10-08 DIAGNOSIS — C73 Malignant neoplasm of thyroid gland: Secondary | ICD-10-CM

## 2018-10-08 DIAGNOSIS — E785 Hyperlipidemia, unspecified: Secondary | ICD-10-CM

## 2018-10-08 DIAGNOSIS — I480 Paroxysmal atrial fibrillation: Secondary | ICD-10-CM

## 2018-10-08 DIAGNOSIS — J101 Influenza due to other identified influenza virus with other respiratory manifestations: Secondary | ICD-10-CM

## 2018-10-08 DIAGNOSIS — Z950 Presence of cardiac pacemaker: Secondary | ICD-10-CM

## 2018-10-08 DIAGNOSIS — H5440 Blindness, one eye, unspecified eye: Secondary | ICD-10-CM

## 2018-10-08 DIAGNOSIS — C4491 Basal cell carcinoma of skin, unspecified: Secondary | ICD-10-CM

## 2018-10-08 DIAGNOSIS — I4891 Unspecified atrial fibrillation: Secondary | ICD-10-CM

## 2018-10-08 DIAGNOSIS — I10 Essential (primary) hypertension: Secondary | ICD-10-CM

## 2018-10-08 DIAGNOSIS — H9190 Unspecified hearing loss, unspecified ear: Secondary | ICD-10-CM

## 2018-10-08 MED ORDER — ALUMINUM-MAGNESIUM HYDROXIDE 200-200 MG/5 ML PO SUSP
30 mL | ORAL | 0 refills | Status: DC | PRN
Start: 2018-10-08 — End: 2018-10-08

## 2018-10-08 MED ORDER — SODIUM CHLORIDE 0.9 % IV SOLP
INTRAVENOUS | 0 refills | Status: DC
Start: 2018-10-08 — End: 2018-10-08

## 2018-10-08 MED ORDER — ACETAMINOPHEN 325 MG PO TAB
650 mg | ORAL | 0 refills | Status: DC | PRN
Start: 2018-10-08 — End: 2018-10-08

## 2018-10-08 MED ORDER — SODIUM CHLORIDE 0.9 % IV SOLP
1000 mL | Freq: Once | INTRAVENOUS | 0 refills | Status: CP
Start: 2018-10-08 — End: ?
  Administered 2018-10-08: 15:00:00 1000 mL via INTRAVENOUS

## 2018-10-08 MED ORDER — ASPIRIN 81 MG PO CHEW
324 mg | Freq: Once | ORAL | 0 refills | Status: DC
Start: 2018-10-08 — End: 2018-10-08

## 2018-10-08 MED ORDER — ONDANSETRON HCL (PF) 4 MG/2 ML IJ SOLN
4 mg | INTRAVENOUS | 0 refills | Status: DC | PRN
Start: 2018-10-08 — End: 2018-10-08

## 2018-10-08 MED ORDER — DIPHENHYDRAMINE HCL 25 MG PO CAP
25 mg | ORAL | 0 refills | Status: DC | PRN
Start: 2018-10-08 — End: 2018-10-08

## 2018-10-08 MED ORDER — DIPHENHYDRAMINE HCL 50 MG/ML IJ SOLN
25 mg | INTRAVENOUS | 0 refills | Status: DC | PRN
Start: 2018-10-08 — End: 2018-10-08

## 2018-10-08 NOTE — Discharge Instructions - Pharmacy
Physician Discharge Summary      Name: Kenneth Buchanan  Medical Record Number: [id number]        Account Number:  [id number]  Date Of Birth:  [birth year]                         Age:  78 years   Admit date:  10/08/2018                     Discharge date:  10/08/2018    Attending Physician:  Dr. Idamae Lusher, MD               Service: Med-Cardiovasc    Physician Summary completed by: Dorena Cookey, APRN-NP     Reason for hospitalization: cardiac catheterization    Significant PMH:   Medical History:   Diagnosis Date   ??? Arthritis    ??? Atrial fibrillation (HCC)     s/p cardioversion 2014   ??? Basal cell carcinoma    ??? CAD (coronary artery disease) 1992   ??? CAD (coronary artery disease), native coronary artery 2009    Coronary artery disease, status post percutaneous coronary intervention of saphenous     vein graft to right coronary artery on 10/26/2007 at [facility]. Personal history of premature coronary artery disease -- The patient underwent his     first percutaneous coronary intervention at age 63.    ??? Essential hypertension 07/14/2011   ??? GERD (gastroesophageal reflux disease)     GERD   ??? Hearing loss    ??? Hyperlipidemia    ??? Influenza A    ??? 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     ??? 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 PAF (paroxysmal atrial fibrillation) (HCC) 02/07/2013   ??? Persistent atrial fibrillation (HCC) 08/2015   ??? Restless leg syndrome    ??? Thyroid cancer (HCC)      Allergies: Valium [diazepam]    Admission Lab/Radiology studies notable for:   Hematology:    Lab Results   Component Value Date    HGB 12.2 10/04/2018    HCT 37.7 10/04/2018    PLTCT 210 10/04/2018    WBC 6.4 10/04/2018    NEUT 69 02/06/2017    ANC 3.00 02/06/2017    ALC 0.80 02/06/2017    MONA 9 02/06/2017    AMC 0.40 02/06/2017    ABC 0.00 02/06/2017    MCV 96.4 10/04/2018    MCHC 32.4 10/04/2018    MPV 9.4 11/29/2017    RDW 14.2 10/04/2018   , General Chemistry:    Lab Results Component Value Date    NA 140 10/04/2018    K 4.2 10/04/2018    CL 108 10/04/2018    GAP 12 01/30/2018    BUN 18 10/04/2018    CR 1.12 10/04/2018    GLU 95 10/04/2018    CA 8.3 10/04/2018    ALBUMIN 3.6 09/20/2018    OBSCA 0.99 08/07/2014    MG 2.2 10/04/2018    TOTBILI 0.48 09/20/2018    and Lipid Profile:   Lab Results   Component Value Date    CHOL 129 07/31/2017    TRIG 76 07/31/2017    HDL 37 07/31/2017    LDL 81 07/31/2017    VLDL 15 07/31/2017         Brief Hospital Course:   Mr. Kenneth Buchanan is  a 78 year old male with history of hypertension, hyperlipidemia, coronary artery disease with previous bypass in 2003.  In 2009 he had stenting to the vein graft to the right coronary artery.  In 2017 he had in-stent restenosis at the same location and had another angioplasty and stent.  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  He has paroxysmal atrial fibrillation on chronic anticoagulation and amiodarone.  He had a thallium study in March 2020 that was a low risk for ischemia.  He recently saw 1 of the nurse practitioners with complaints of a funny sinking feeling in his chest several times daily.  This was mostly with exertion but could occur with rest.  The symptoms progressed and he was evaluated by Dr. [name] and referred for cardiac catheterization to further define his coronary anatomy.    Cardiac catheterization was done on 10/08/18 and revealed severe native disease with patent LIMA to the LAD and vein graft to the RCA.  Medical management and risk factor modification was recommended.  He was monitored post procedure without right groin access site complications.      He is a candidate for ongoing risk factor and medical management. Heart healthy diet, exercise, and weight loss were encouraged.   Upon discharge the patient and family were given post procedure instructions/restrictions as well as written instructions ( see Discharge instructions) Condition at Discharge: Stable, right groin D/I, without evidence of hematoma, bleeding, or bruit. Distal pulses intact. Pt was ambulatory in the halls without complaint prior to discharge. BP (!) 143/85  - Pulse 60  - Temp 36.6 ???C (97.8 ???F)  - SpO2 98%       Discharge Diagnoses:     Hospital Problems        Active Problems    Coronary artery disease involving coronary bypass graft of native heart without angina pectoris    S/P CABG x 2    Essential hypertension    Persistent atrial fibrillation (HCC)    Sinus node dysfunction (HCC)    Pacemaker          Surgical Procedures: None    Significant Diagnostic Studies and Procedures:   Cardiac cath: 10/08/18  FINAL IMPRESSION:    1. Severe native coronary artery disease as described above.  2. Patent LIMA to the LAD and vein graft to the RCA.  3. Normal LVEDP.  ???  RECOMMENDATIONS:  Continue optimal medical therapy and aggressive lifestyle risk factor modification.    Consults:  None    Patient Disposition: Home       Patient instructions/medications:       Cardiac Diet    Limiting unhealthy fats and cholesterol is the most important step you can take in reducing your risk for cardiovascular disease.  Unhealthy fats include saturated and trans fats.  Monitor your sodium and cholesterol intake.  Restrict your sodium to 2g (grams) or 2000mg  (milligrams) daily, and your cholesterol to 200mg  daily.    If you have questions regarding your diet at home, you may contact a dietitian at [phone].       Report These Signs and Symptoms    Please contact your doctor if you have any of the following symptoms: Chest pain, shortness of breath, lightheadedness, dizziness, near fainting, palpitations, abd pain, back pain, or bleeding.     Risk Reduction Plan    Cardiac Event Personal Risk Factor Reduction Plan  **Take this sheet to your physician to show treatment recommendations**  [place] [address] [place]  Admission Date: 10/08/2018 LOS: @LOS @       Blood Pressure Risk Goal: Keep blood pressure below 130/80  Your Numbers:  BP Readings from Last 1 Encounters:  10/08/18 : (!) 143/85     Plan: High blood pressure is the single most important risk factor for cardiac events because it's the #1 cause of cardiac events.  Take medication as prescribed and monitor your blood pressure.    Abnormal lipids (fats in blood) Risk   Goal: Total Cholesterol: <200  LDL (bad cholesterol): primary prevention <100   LDL (bad cholesterol): secondary prevention < 70  HDL (good cholesterol): >40 for men, >50 for women  Triglycerides: <150  Your Numbers: Cholesterol       Date                     Value               Ref Range           Status                07/31/2017               129 (L)             150 - 200           Final            ----------  LDL       Date                     Value               Ref Range           Status                07/31/2017               81                                      Final            ----------  HDL       Date                     Value               Ref Range           Status                07/31/2017               37                                      Final            ----------  Triglycerides       Date                     Value               Ref Range           Status                07/31/2017  76                                      Final            ----------  Plan: Diets high in saturated fat, trans fat and cholesterol can raise blood cholesterol levels increasing your risk of having a cardiac event.  Take medication as prescribed and eat a heart healthy, low sodium diet.    Smoking Risk   Goals: If you smoke, STOP!   Plan: Smoking DOUBLES your risk of having a cardiac event. Quitting can greatly reduce your risk. To register for smoking cessation program call [phone] or visit www.smokefree.gov    Diabetes Risk   Goal: Non-diabetic: Below 5.7%  Goal for diabetic: Less than 7%  Your Numbers: No results found for: HGBA1C Plan: If you have diabetes, even if treated, you are at an increased risk of having a cardiac event.     Alcohol Use Risk  Goal: Alcohol use can lead to a cardiac event.  Plan: For men, limit intake to no more than 2 drinks per day.  For women, limit intake to no more than one drink per day.    Weight Management Risk   Goal: Healthy: BMI is 18.5 to 24.9  Overweight: BMI is 25 to 29.9  Obese: BMI is 30 or higher  Morbid Obesity: BMI [...]  Your Numbers: Your    Plan: If you have questions about your diet after you go home, you can call a dietitian at [phone]    Physical Activity Risk   Goal: Patients should have approval by a physician prior to beginning an exercise program.  Plan: Try to get at least 30 minutes of moderate physical activity five days a week or 20 minutes of vigorous physical activity three days a week with your doctor's approval.    To the Neurology and the NeuroSurg Discharge order sets set add a new order in the education section titled Risk Reduction Plan, in the comments section add the following (default select this new order for neuro and neuro surg and ensure this information is added to the AVS).     Questions About Your Stay    For questions or concerns regarding your hospital stay:    - DURING BUSINESS HOURS (8:00 AM - 4:30 PM):    Call [phone] and asked to be transferred to your discharge attending physician.    - AFTER BUSINESS HOURS (4:30 PM - 8:00 AM, on weekends, or holidays):  Call [phone] and ask the operator to page the on-call doctor for the discharge attending physician.     Discharging attending physician: Laney Pastor [[id number]]      Procedure Specific Activity    *You may drive after 2 days.  *You may shower after discharge.  *NO tub baths, hot tubs, or swimming for 5 days.  *NO lifting greater than 15 pounds for 1 week.  *NO sexual or strenuous activity for 1 week.     Incision Care    *Call if there is an increase in pain, swelling, or redness. *DO NOT soak incision in water.  *NO tub baths, hot tubs, or swimming.  *You may shower after discharge.     Return Appointment    Please call Cardiovascular Medicine ( [place] ) Central Scheduling at [phone], Monday through Friday, between 8 a.m. to 5 p.m. to make your follow  up hospitalization appointment for 3-4 months.     [place] Provider Dorris Fetch [[id number]]    Location [facility]      Request for Cardiology Appointment   Standing Status: Future Standing Exp. Date: 10/08/23     Scheduling Priority: Routine    Schedule OV with (1st choice Provider) [name] M.D.    Special Visit Type General Cardiology    Location of Appointment [facility] / Mon-Fri       Current Discharge Medication List       CONTINUE these medications which have NOT CHANGED    Details   acetaminophen (TYLENOL) 500 mg tablet Take 1,000 mg by mouth as Needed for Pain.    PRESCRIPTION TYPE:  Historical Med      aflibercept(+) (EYLEA) 2 mg/0.05 mL intravitreal injection 2 mg by INTRAVITREAL route. Every 6 weeks    PRESCRIPTION TYPE:  Historical Med      amiodarone (CORDARONE) 200 mg tablet Take one tablet by mouth daily. Take with food.  Qty: 90 tablet, Refills: 0    PRESCRIPTION TYPE:  Normal      aspirin EC 81 mg tablet Take one tablet by mouth daily. Hold for 7 days, resume 8/27  Qty: 90 tablet, Refills: 3    PRESCRIPTION TYPE:  Normal      atorvastatin (LIPITOR) 80 mg tablet TAKE 1 TABLET BY MOUTH EVERY DAY  Qty: 90 tablet, Refills: 2    PRESCRIPTION TYPE:  Normal  Comments: 08/10/2018 9:10:53 AM  Associated Diagnoses: Other hyperlipidemia      levothyroxine (SYNTHROID) 150 mcg tablet Take one tablet by mouth daily 30 minutes before breakfast. Indications: a condition with low thyroid hormone levels  Qty: 90 tablet, Refills: 3    PRESCRIPTION TYPE:  Normal  Associated Diagnoses: Post-surgical hypothyroidism      nitroglycerin (NITROSTAT) 0.4 mg tablet Place 1 tablet under tongue every 5 minutes as needed for Chest Pain. Max of 3 tablets, call 911.  Qty: 25 tablet, Refills: 3    PRESCRIPTION TYPE:  Normal  Associated Diagnoses: Coronary artery disease involving coronary bypass graft of native heart without angina pectoris; Pure hypercholesterolemia; Essential hypertension; Persistent atrial fibrillation (HCC); CAD S/P percutaneous coronary angioplasty      other medication 1 Dose. cbd   225mg  daily    PRESCRIPTION TYPE:  Historical Med      rivaroxaban (XARELTO) 15 mg tablet Take one tablet by mouth daily with dinner. Take with food.  Qty: 30 tablet, Refills: 11    PRESCRIPTION TYPE:  Normal  Comments: Please consider 90 day supplies to promote better adherence      timolol XE(+) (TIMOPTIC-XR) 0.5 % ophthalmic gel Place 1 drop into or around eye(s) daily.    PRESCRIPTION TYPE:  Historical Med           Scheduled appointments:    Oct 10, 2018 11:00 AM CDT  Echo Doppler with [facility]  [facility] ([facility] Procedural) [address]  [place] [address]  [phone]   Feb 20, 2019 10:00 AM CST  Return Patient with Paulene Floor, MD  [facility] (ENT) [address]  [address] [place] [address]  [phone]   May 23, 2019 10:00 AM CST  Return Patient with Adonis Housekeeper, MD  [facility] (ENT) [address]  [address] [place] [address]  [phone]  Pending items needing follow up: as above    Signed:  Dorena Cookey, APRN-NP  10/08/2018      cc:  Primary Care Physician:  Lona Kettle   Verified  Referring physicians:  Laney Pastor, MD   Additional provider(s): Dr. Leanne Lovely, MD

## 2018-10-08 NOTE — Progress Notes
Patient arrived on unit via ambulation accompanied by family. Patient transferred to the bed without assistance. Frailty score equals 3  Assessment completed, refer to flowsheet for details. Orders released, reviewed, and implemented as appropriate. Oriented to surroundings, call light within reach. Plan of care reviewed.  Will continue to monitor and assess.

## 2018-10-08 NOTE — Patient Instructions
Manual Sheath Removal From A Large Vein Or Artery-TUKH  When you go home:  ??? You may shower 24 hours after your procedure.  ??? Do not sit in water for one week. (No bath tub, swimming pool/hot tub, etc.)  ??? Keep the area clean and dry for one week (except for daily showers).  ??? Be sure your hands are clean when touching near the site.  ??? If a band-aid or dressing is still in place remove it before showering.  ??? Wash and dry thoroughly but gently.  ??? If needed, for your comfort, you may place a clean band-aid over the puncture site after you are clean and dry. It is best to leave it open to air as soon as it is comfortable to do so.  ??? Do not use ointments, creams, or powders on puncture site.  ??? Inspect site daily.???  Post Procedure Radiation Exposure  You???just completed a procedure which exposed you to radiation levels that may result in skin injury such as reddening of the skin which should fade with time, itching or temporary hair loss around chest or back.?????????When you go home you are advised to do the following:  ? Perform self-examination of your chest or back area or have a partner assist you for 2-10 weeks after your procedure.  Activity: (Unless otherwise instructed or unable to perform)  ??? Avoid any exertion for one week. Exertion is lifting over 15 lbs or pushing, pulling or straining.  ??? Avoid excessive bending, stooping, or stair climbing for 2 days. It is ok to go up stairs or bend over but take it slowly and keep it to a minimum.  ??? You may be up and about while relaxing at home as you recover.  ??? You may resume sexual activity in one week.  ??? You may begin driving 2 days after your procedure if you are otherwise able to drive.???  ---It is common to have mild soreness and/or a small, soft bruise around the site that can take up to two weeks to go away. A small (dime to quarter sized) lump is also normal. A small amount of blood (not more than a teaspoon) from the site is also common.??? When to call the Doctor: Complications are rare, but can happen.  ??? If you have significant bleeding (more than a teaspoon) or a lump underneath the skin (bigger than a golf ball) at the site lie down, apply firm pressure at the site and call 911. Bleeding from a large vessel needs professional help.  ??? If you have signs of infection at the site such as: redness, warm to touch, drainage, increasing soreness, a fever (100 degrees or more) and/or chills.  ??? Soreness that continues more than a week or unusual pain at the puncture site.  ??? Numbness, tingling, weakness in the affected leg.  ??? If your leg becomes cold and pale.  ??? If you have changes of vision, slurred speech or one-sided weakness.???  ??? If you have prolonged skin redness (similar to a sunburn) and/or itching around chest or back.???  ??? If you have blistering around chest or back.  Who do I contact if I need to speak with someone?  During Business Hours:  ??? [place] Cardiology Office at the [facility]: [phone] [facility])  ??? [place]: [phone] (Monday-Friday)  ??? [place]: [phone] (Monday-Friday)  ??? [place]: [phone] (Tuesday and Thursday)  ??? [place]: [phone] (Monday-Friday)  ??? [facility]: [phone] (Tuesday, Wednesday and Friday)  ???  [place]: [phone] (Tuesday, Wednesday and Friday)  Nights and Weekends  ??? [place] Cardiology Office at the [facility]: [phone]???  This education is meant to serve as a resource to you and your family. It is not meant to be all inclusive. The members of the [facility] at [facility]iology, [phone], will be glad to answer any questions you may have about this booklet or your procedure.

## 2018-10-08 NOTE — Progress Notes
Patient discharged to home with all belongings.  Discharge instructions, med reconciliation and home wound care instructions given and explained to patient and family both verbally and written.  Accompanied by spouse.  No complaints of pain or discomfort.   Groin site  remains clean, dry, and intact with no evidence of a hematoma after ambulation.  Patient escorted to lobby via Transport.  Patient to follow up with [facility]) or on-call physician with any additional questions or concerns.  All contact numbers provided.  Patient and family acceptant of DC instuctions and report understanding to all information.

## 2018-10-09 ENCOUNTER — Encounter: Admit: 2018-10-09 | Discharge: 2018-10-09

## 2018-10-10 ENCOUNTER — Encounter: Admit: 2018-10-10 | Discharge: 2018-10-10

## 2018-10-10 ENCOUNTER — Ambulatory Visit: Admit: 2018-10-10 | Discharge: 2018-10-11

## 2018-10-10 DIAGNOSIS — Z951 Presence of aortocoronary bypass graft: Secondary | ICD-10-CM

## 2018-10-10 DIAGNOSIS — I495 Sick sinus syndrome: Principal | ICD-10-CM

## 2018-10-10 DIAGNOSIS — Z79899 Other long term (current) drug therapy: Secondary | ICD-10-CM

## 2018-10-10 DIAGNOSIS — I2510 Atherosclerotic heart disease of native coronary artery without angina pectoris: Secondary | ICD-10-CM

## 2018-10-10 DIAGNOSIS — C73 Malignant neoplasm of thyroid gland: Secondary | ICD-10-CM

## 2018-10-10 DIAGNOSIS — I10 Essential (primary) hypertension: Secondary | ICD-10-CM

## 2018-10-10 DIAGNOSIS — R002 Palpitations: Secondary | ICD-10-CM

## 2018-10-10 DIAGNOSIS — I480 Paroxysmal atrial fibrillation: Secondary | ICD-10-CM

## 2018-10-10 DIAGNOSIS — I4819 Other persistent atrial fibrillation: Secondary | ICD-10-CM

## 2018-10-10 DIAGNOSIS — Z7901 Long term (current) use of anticoagulants: Secondary | ICD-10-CM

## 2018-10-10 DIAGNOSIS — G4733 Obstructive sleep apnea (adult) (pediatric): Secondary | ICD-10-CM

## 2018-10-10 NOTE — Progress Notes
[employer] Online Enrollment Complete

## 2018-10-10 NOTE — Progress Notes
Holter Placement Record  Ordering Physician: LDB  Diagnosis: Palps  Brand:  Cardionet   Length: 48 hours  Holter Number: [id number]  Card Number: [id number]  Holter start: 11:46  Location where Holter was placed: [place]  Will Holter be returned by mail? No        PT will be returning to [facility] clinic on 10/16/18

## 2018-10-12 ENCOUNTER — Encounter: Admit: 2018-10-12 | Discharge: 2018-10-12

## 2018-10-12 DIAGNOSIS — Z950 Presence of cardiac pacemaker: Secondary | ICD-10-CM

## 2018-10-15 ENCOUNTER — Encounter: Admit: 2018-10-15 | Discharge: 2018-10-15

## 2018-10-15 DIAGNOSIS — R0602 Shortness of breath: Secondary | ICD-10-CM

## 2018-10-15 NOTE — Telephone Encounter
I called Kenneth Buchanan and reviewed note from Dr Virgina Organ.  He prefers to see a Pulmonogist in [place] so I sent a referral to Dr Andres Labrum office.  I called and got him scheduled for 8/6 at 10:46.  I sent order and chart notes to their office and called Kenneth Tomson back and LM with his appointment details. Gaynelle Cage, RN

## 2018-10-15 NOTE — Telephone Encounter
-----   Message from Vertell Novak, MD sent at 10/10/2018  8:10 PM CDT -----  Dear Kenneth Buchanan. Kenneth Buchanan was recently evaluated with a heart catheterization, he did not undergo any PCI, medical management was recommended.  The echocardiogram did reveal normal systolic function.Please call him up and ask him if he still has shortness of breath if he does then he needs to undergo future pulmonary evaluation, we can probably send him to Dr. Clinton Quant in [place] or we can send him to [place], I would like this appointment as soon as possible, this patient has been short of breath for quite a while.  We can also ask him to take the amiodarone 200 mg every other day.Thank you  ----- Message -----  From: Paulina Fusi, MD  Sent: 10/10/2018   1:11 PM CDT  To: Vertell Novak, MD

## 2018-10-22 ENCOUNTER — Encounter: Admit: 2018-10-22 | Discharge: 2018-10-22

## 2018-10-30 ENCOUNTER — Encounter: Admit: 2018-10-30 | Discharge: 2018-10-30

## 2018-10-31 ENCOUNTER — Encounter: Admit: 2018-10-31 | Discharge: 2018-10-31

## 2018-10-31 DIAGNOSIS — Z5181 Encounter for therapeutic drug level monitoring: Secondary | ICD-10-CM

## 2018-10-31 NOTE — Telephone Encounter
10/31/2018 4:21 PM   Patient sent My Chart message with question about his dosage of amiodarone. I called patient to clarify. Patient has been taking amiodarone 200 mg every other day since the dose was decreased on 10/10/18 by Dr Virgina Organ. Medication list was updated in the comment section at that time.  Wilder Glade, LPN

## 2018-11-01 ENCOUNTER — Encounter: Admit: 2018-11-01 | Discharge: 2018-11-01

## 2018-11-01 NOTE — Telephone Encounter
CXR ordered per LDB. Pt does not need to scheduled CTs. Unable to cancel those orders. Called pt to set up him up with in person OV either with LDB in 2-3 weeks or before with someone else. No answer. LM requesting pt send in remote transmission and call back to set up OV. MyChart message sent to pt.

## 2018-11-02 ENCOUNTER — Encounter: Admit: 2018-11-02 | Discharge: 2018-11-02

## 2018-11-02 NOTE — Telephone Encounter
-----   Message from Myriam Jacobson, RN sent at 11/01/2018  4:09 PM CDT -----  Regarding: FW: Visit Follow-Up Question  Contact: [phone]  Could someone try Mr Sholl again? I was unable to reach him and did not hear back from him.     Thank you!  ----- Message -----  From: Jacolyn Reedy, MD  Sent: 10/31/2018   7:54 PM CDT  To: Myriam Jacobson, RN  Subject: FW: Visit Follow-Up Question                     No need for CT  Needs PAL CXR, lets update a remote pacer check  Needs in person OV-which I can do in 2-3 weeks, or someone else before  ----- Message -----  From: Myriam Jacobson, RN  Sent: 10/31/2018   5:29 PM CDT  To: Jacolyn Reedy, MD  Subject: FW: Visit Follow-Up Question                     Pt experiencing breathlessness at rest and low energy - recent CABG 6/22. Also do you still want these CTs done? Not sure what happened but they were not completed. Ordered back in 11/2017, SHS placed pacemaker in 11/2017. Recs?  ----- Message -----  From: Grier Rocher, RN  Sent: 10/31/2018   3:37 PM CDT  To: Cvm Nurse Ep  Subject: FW: Visit Follow-Up Question                       ----- Message -----  From: Clayton Lefort Areola  Sent: 10/31/2018   3:33 PM CDT  To: Cvm Nurse Triage [place]  Subject: Visit Follow-Up Question                         Kenneth Buchanan DOB 08/17/1940 Recently I have had an in depth cardiac workup.  I continue to have nausea sometimes all day, breathlessness  sometimes at rest and sometimes with activity and constant low energy. Dr Virgina Organ set up a pulmonary consult.   MY  CHART says I have a CT chest  with Cardiac order and a cardiac structure w/or wo contrast. I assume that these has to do with my Amnoidorone RX.    How do I schedule the 1st test  and is the 2nd one still necessary .  These orders expires Aug 1st.  Thanks!!  Kenneth Buchanan

## 2018-11-02 NOTE — Telephone Encounter
Attempted to contact patient in f/u regarding recs per LDB.  LVM for patient requesting CB.    Patient did read my chart message from [place] yesterday at ~3pm.

## 2018-11-11 ENCOUNTER — Encounter: Admit: 2018-11-11 | Discharge: 2018-11-11

## 2018-11-12 ENCOUNTER — Encounter: Admit: 2018-11-12 | Discharge: 2018-11-12

## 2018-11-12 DIAGNOSIS — E785 Hyperlipidemia, unspecified: Secondary | ICD-10-CM

## 2018-11-12 DIAGNOSIS — I4819 Other persistent atrial fibrillation: Secondary | ICD-10-CM

## 2018-11-12 DIAGNOSIS — I4891 Unspecified atrial fibrillation: Secondary | ICD-10-CM

## 2018-11-12 DIAGNOSIS — I10 Essential (primary) hypertension: Secondary | ICD-10-CM

## 2018-11-12 DIAGNOSIS — H9190 Unspecified hearing loss, unspecified ear: Secondary | ICD-10-CM

## 2018-11-12 DIAGNOSIS — J101 Influenza due to other identified influenza virus with other respiratory manifestations: Secondary | ICD-10-CM

## 2018-11-12 DIAGNOSIS — I480 Paroxysmal atrial fibrillation: Secondary | ICD-10-CM

## 2018-11-12 DIAGNOSIS — R799 Abnormal finding of blood chemistry, unspecified: Secondary | ICD-10-CM

## 2018-11-12 DIAGNOSIS — I2510 Atherosclerotic heart disease of native coronary artery without angina pectoris: Secondary | ICD-10-CM

## 2018-11-12 DIAGNOSIS — M1990 Unspecified osteoarthritis, unspecified site: Secondary | ICD-10-CM

## 2018-11-12 DIAGNOSIS — Z950 Presence of cardiac pacemaker: Secondary | ICD-10-CM

## 2018-11-12 DIAGNOSIS — C4491 Basal cell carcinoma of skin, unspecified: Secondary | ICD-10-CM

## 2018-11-12 DIAGNOSIS — K219 Gastro-esophageal reflux disease without esophagitis: Secondary | ICD-10-CM

## 2018-11-12 DIAGNOSIS — G2581 Restless legs syndrome: Secondary | ICD-10-CM

## 2018-11-12 DIAGNOSIS — C73 Malignant neoplasm of thyroid gland: Secondary | ICD-10-CM

## 2018-11-12 DIAGNOSIS — H5440 Blindness, one eye, unspecified eye: Secondary | ICD-10-CM

## 2018-11-12 DIAGNOSIS — E890 Postprocedural hypothyroidism: Secondary | ICD-10-CM

## 2018-11-12 NOTE — Patient Instructions
I have faxed lab order to [place] to check 8 AM cortisol and iron study to screen for problem with adrenal insufficiency or iron deficiency causing symptoms of fatigue.  Regarding thyroid cancer monitoring, will plan to do an ultrasound and blood work on 02/23/19. [place] directly at [phone]

## 2018-11-12 NOTE — Progress Notes
Date of Service: 11/12/2018   Obtained patient's verbal consent to treat them and their agreement to [facility] financial policy and NPP via this telehealth visit during the [facility]    Subjective:             Kenneth Buchanan is a 78 y.o. male.    History of Present Illness     Kenneth Buchanan is a 78 year old male with a history of hypertension, hyperlipidemia, coronary artery disease with previous bypass, sinus node dysfunction, paroxysmal atrial fibrillation on chronic anticoagulation and amiodarone, who was seen today as a follow up visit for papillary thyroid cancer. He was last seen on 03/31/17. Since the last visit, him and wife are concerned about low energy, funny feeling when doing things like putting on clothes, walking, lifting, going down stair.He gets nauseated a lot without abdominal pain or changes in bowel movement. He denied cough, palpitation, fever. He has been using CPAP since Feb 2020 which helped.   He recently had a cardiac catheterization was done on 10/08/18 and revealed severe native disease with patent LIMA to the LAD and vein graft to the RCA.  An echocardiogram and pulmonary function test were also unremarkable.  He did had an episode of dropping hemoglobin and following pacemaker placement in August 2019 and seems like his symptoms started at that time.  He did received a steroid injection in  both shoulders in May 2020.  He denied other steroid exposure.  He lost 10 lbs since last year.       He denied any loose stool, shakiness, or palpitation. He has a history of CAD and paroxysmal atrial fibrillation. He has been on amiodarone in the past four years. His weight has been fairly stable.       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  None of the 10 lymph nodes from central neck dissection were cancerous.  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   - His postoperative thyroglobulin was 0.8 and antibody less than 3 on November 25, 2014.  Unfortunately, there was no TSH at that time.   - He has been taking levothyroxine  150 mcg daily  with most recent TSH was 2.68 on 09/20/2018.  -  Most recent thyroglobulin was 0.4 on 03/21/2018 with TSH of 1.18 at that time.   - Most recent head and neck ultrasound 11/23/17 showed stable 5 mm elongated benign appearing nodule on left thyroid bed.  He takes Lt4 regularly in the morning on empty stomach.      Family History:  Denied family history of thyroid cancer.  Mother had coronary artery disease and passed away at age 39.  Denied any head or thyroid cancer in the family.    SH:  He quit smoking in 1983 for three packs per day for 23 years.  He is married.  He consumes eight beers per week.     Medical History:   Diagnosis Date   ??? Arthritis    ??? Atrial fibrillation (HCC)     s/p cardioversion 2014   ??? Basal cell carcinoma    ??? CAD (coronary artery disease) 1992   ??? CAD (coronary artery  disease), native coronary artery 2009    Coronary artery disease, status post percutaneous coronary intervention of saphenous     vein graft to right coronary artery on 10/26/2007 at [facility]. Personal history of premature coronary artery disease -- The patient underwent his     first percutaneous coronary intervention at age 64.    ??? Essential hypertension 07/14/2011   ??? GERD (gastroesophageal reflux disease)     GERD   ??? Hearing loss    ??? Hyperlipidemia    ??? Influenza A    ??? 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     ??? 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 PAF (paroxysmal atrial fibrillation) (HCC) 02/07/2013 ??? Persistent atrial fibrillation (HCC) 08/2015   ??? Restless leg syndrome    ??? Thyroid cancer (HCC)             Review of Systems  Constitutional:   Fatigue: yes  Pain: 0/10  Skin:   Heat or cold intolerance : yes  Dry skin : no  Flushing episodes :  yes  Hair loss : no  Excessive body or facial hair: no  Head   Headaches: no  Double or blurry vision : no  Difficulty swallowing or choking : no  Feeling of food stuck: no  Neck enlargement or lumps : no  Head or chest radiation exposure : no  Decreased hearing : no  Chest/Heart   Nipple discharge : no  Chest pain or pressure: no  Heartburn: no  Shortness of breath: yes  Heart racing or pounding :no  Digestion   Abdominal or belly pain : no  Constipation : no  Diarrhea : no  Nausea and vomiting: no  Reflux : no  Lactose intollerance : no  Reproduction   Decreased interest in sex: yes  Urinary   Kidney stones: no   Night time urination : no  Skeleton   Muscle cramping: yes  Glands   Weight loss : Yes  Weight gain : no  Nerves   Nervous or anxious : yes  Seizures or falls : no  Dizziness : no  numbenss or tingling : no  Mental Health   Do you feel sad? : yes  Sleep problems : yes improved with CPAP    Objective:         ??? acetaminophen (TYLENOL) 500 mg tablet Take 1,000 mg by mouth as Needed for Pain.   ??? aflibercept(+) (EYLEA) 2 mg/0.05 mL intravitreal injection 2 mg by INTRAVITREAL route. Every 6 weeks   ??? amiodarone (CORDARONE) 200 mg tablet Take one tablet by mouth daily. Take with food. (Patient taking differently: Take 200 mg by mouth daily. Take 200mg  by mouth every other day)   ??? aspirin EC 81 mg tablet Take one tablet by mouth daily. Hold for 7 days, resume 8/27   ??? atorvastatin (LIPITOR) 80 mg tablet TAKE 1 TABLET BY MOUTH EVERY DAY   ??? levothyroxine (SYNTHROID) 150 mcg tablet Take one tablet by mouth daily 30 minutes before breakfast. Indications: a condition with low thyroid hormone levels ??? nitroglycerin (NITROSTAT) 0.4 mg tablet Place 1 tablet under tongue every 5 minutes as needed for Chest Pain. Max of 3 tablets, call 911.   ??? other medication 1 Dose. cbd   225mg  daily   ??? rivaroxaban (XARELTO) 15 mg tablet Take one tablet by mouth daily with dinner. Take with food. (Patient taking differently: Take 15  mg by mouth daily with breakfast. Take with food.)   ??? timolol XE(+) (TIMOPTIC-XR) 0.5 % ophthalmic gel Place 1 drop into or around eye(s) daily.     Vitals:    11/12/18 1521   Weight: 89.8 kg (198 lb)   Height: 182.9 cm (72.01)   PainSc: Zero     Body mass index is 26.85 kg/m???.     Physical Exam  General Appearance: moderately overweight, no distress   Respiratory Effort: breathing comfortably, no respiratory distress   Orientation: oriented to time, place and person   Affect & Mood: appropriate and sustained affect   Language and Memory: patient responsive and seems to comprehend information   Neurologic Exam: neurological assessment grossly intact   Other: moves all extremities          Results for DONTERRIUS, RECINE (MRN [id number]) as of 11/12/2018 16:04   Ref. Range 03/21/2018 13:27 09/20/2018 00:00 10/04/2018 00:00   Hemoglobin Latest Ref Range: 14.0 - 52.0   11.8 (L) 12.2 (L)   Hematocrit Latest Ref Range: 42.0 - 52.0   36.9 (L) 37.7 (L)   Platelet Count Unknown  245 210   White Blood Cells Unknown  8.1 6.4   RBC Latest Ref Range: 4.70 - 6.10   3.89 (L) 3.91 (L)   MCV Unknown  95.0 96.4   MCH Latest Ref Range: 37 - 31.0   30.4 31.2 (H)   MCHC Unknown  32.0 32.4   MPV Unknown  *    RDW Unknown  13.9 14.2   Sodium Unknown  139 140   Potassium Unknown  4.1 4.2   Chloride Latest Ref Range: 98 - 107   108 (H) 108 (H)   CO2 Unknown  24.0 27.0   Anion Gap Unknown  *    Blood Urea Nitrogen Unknown  20.0 18   Creatinine Unknown  1.01 1.12   eGFR Non African American Unknown  76.1 67.6   eGFR African American Unknown  *    Glucose Unknown  94 95   Albumin Unknown  3.6 Calcium Latest Ref Range: 8.8 - 10   8.3 (L) 8.3 (L)   Magnesium Unknown   2.2   Total Bilirubin Unknown  0.48    Total Protein Unknown  6.2    AST (SGOT) Unknown  13    ALT (SGPT) Unknown  19    Alk Phosphatase Unknown  70    TSH Unknown 1.18 2.68    Thyroglobulin Latest Units: ng/mL 0.4 (L)     Thyroglobulin Ab Latest Ref Range: <=1 iU/mL <1         Assessment and Plan:      Papillary thyroid carcinoma: A 1.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  He also has microcarcinoma over left lobe of thyroid with no cervical lymph node involvement.  His postop ultrasound from August of 2016 showed no evidence of recurrence as well as his thyroglobulin was appropriately suppressed to 0.8.  Radioactive iodine was not indicated given low risk tumor characteristic and low thyroglobulin level and imaging study postop.  His postoperative thyroglobulin was 0.8 and antibody less than 3 on November 25, 2014.  Unfortunately, there was no TSH at that time. Most recent thyroglobulin was 0.4 on 03/21/2018 with TSH of 1.18 at that time. Most recent head and neck ultrasound 11/23/17 showed stable 5 mm elongated benign appearing nodule on left thyroid bed.  He takes Lt4 regularly in the morning on empty stomach.  He is currently at low risk of thyroid cancer recurrence with mild minimally detectable nonstimulated thyroglobulin level. Target TSH should be in around 0.5-2  but due to history of atrial fibrillation, TSH could be slightly higher as long as not higher than reference range.     Plan:   Continue levothyroxine 150 mcg daily  with most recent TSH was 2.68 on 09/20/2018.  Check TSH and thyroglobulin panel yearly   Check neck ultrasound this year to be scheduled same date of other [place] appointment on 02/20/2019. if stable, every 2 to 3 years afterward.    Fatigue this is likely multifactorial given advanced heart disease in addition to mild anemia and other health problems.  Pulmonary function test was unremarkable.  His thyroid level has been in normal range so unlikely contributing to his symptoms.  Plan to check an 8 AM cortisol, ACTH, to screen for secondary adrenal insufficiency given recent steroid injection and iron studies to screen for iron deficiency.  Lab orders were faxed to [facility].  Return to clinic in 12 months.    Total time reviewing chart and visit 30 minutes.  Estimated counseling time 15 minutes.

## 2018-11-13 ENCOUNTER — Ambulatory Visit: Admit: 2018-11-12 | Discharge: 2018-11-13

## 2018-11-13 DIAGNOSIS — R5382 Chronic fatigue, unspecified: Secondary | ICD-10-CM

## 2018-11-13 DIAGNOSIS — C73 Malignant neoplasm of thyroid gland: Principal | ICD-10-CM

## 2018-11-15 ENCOUNTER — Ambulatory Visit: Admit: 2018-11-15 | Discharge: 2018-11-15

## 2018-11-15 ENCOUNTER — Encounter: Admit: 2018-11-15 | Discharge: 2018-11-15

## 2018-11-15 DIAGNOSIS — Z951 Presence of aortocoronary bypass graft: Secondary | ICD-10-CM

## 2018-11-15 DIAGNOSIS — Z950 Presence of cardiac pacemaker: Secondary | ICD-10-CM

## 2018-11-15 DIAGNOSIS — H5440 Blindness, one eye, unspecified eye: Secondary | ICD-10-CM

## 2018-11-15 DIAGNOSIS — Z79899 Other long term (current) drug therapy: Secondary | ICD-10-CM

## 2018-11-15 DIAGNOSIS — H9190 Unspecified hearing loss, unspecified ear: Secondary | ICD-10-CM

## 2018-11-15 DIAGNOSIS — C4491 Basal cell carcinoma of skin, unspecified: Secondary | ICD-10-CM

## 2018-11-15 DIAGNOSIS — Z7901 Long term (current) use of anticoagulants: Secondary | ICD-10-CM

## 2018-11-15 DIAGNOSIS — G2581 Restless legs syndrome: Secondary | ICD-10-CM

## 2018-11-15 DIAGNOSIS — I4891 Unspecified atrial fibrillation: Secondary | ICD-10-CM

## 2018-11-15 DIAGNOSIS — K219 Gastro-esophageal reflux disease without esophagitis: Secondary | ICD-10-CM

## 2018-11-15 DIAGNOSIS — G4733 Obstructive sleep apnea (adult) (pediatric): Secondary | ICD-10-CM

## 2018-11-15 DIAGNOSIS — I10 Essential (primary) hypertension: Secondary | ICD-10-CM

## 2018-11-15 DIAGNOSIS — I480 Paroxysmal atrial fibrillation: Secondary | ICD-10-CM

## 2018-11-15 DIAGNOSIS — I4819 Other persistent atrial fibrillation: Principal | ICD-10-CM

## 2018-11-15 DIAGNOSIS — I495 Sick sinus syndrome: Secondary | ICD-10-CM

## 2018-11-15 DIAGNOSIS — I2510 Atherosclerotic heart disease of native coronary artery without angina pectoris: Secondary | ICD-10-CM

## 2018-11-15 DIAGNOSIS — M1990 Unspecified osteoarthritis, unspecified site: Secondary | ICD-10-CM

## 2018-11-15 DIAGNOSIS — J101 Influenza due to other identified influenza virus with other respiratory manifestations: Secondary | ICD-10-CM

## 2018-11-15 DIAGNOSIS — E785 Hyperlipidemia, unspecified: Secondary | ICD-10-CM

## 2018-11-15 DIAGNOSIS — C73 Malignant neoplasm of thyroid gland: Secondary | ICD-10-CM

## 2018-11-15 MED ORDER — AMIODARONE 200 MG PO TAB
200 mg | ORAL_TABLET | Freq: Every day | ORAL | 3 refills | 42.00000 days | Status: DC
Start: 2018-11-15 — End: 2018-11-22

## 2018-11-15 MED ORDER — RIVAROXABAN 15 MG PO TAB
15 mg | ORAL_TABLET | Freq: Every day | ORAL | 3 refills | 30.00000 days | Status: DC
Start: 2018-11-15 — End: 2019-03-25

## 2018-11-15 NOTE — Telephone Encounter
Per LDB, patient needs to be on Amiodarone 200 mg every other day.   [facility] updated.   No further questions or concerns at this time.

## 2018-11-15 NOTE — Telephone Encounter
-----   Message from Louis Meckel, LPN sent at 2/64/1583 11:25 AM CDT -----  Regarding: LDB- Call ASAP  VM from [facility] [phone] on triage line.  Said to call ASAP about Rx they just got from [place].

## 2018-11-15 NOTE — Telephone Encounter
Kenneth Meckel, LPN  P Cvm Nurse Ep            VM from Kenneth Buchanan at [facility] [phone] on triage line at 11:32am.   Kenneth Buchanan that we just talked to one of their techs but she needs to speak to [place] about the Rx.   Call and ask for Kenneth Buchanan and talk to her only.   Asked that we call ASAP.      Spoke with pharmacy tech who tells me that Kenneth Buchanan is at lunch but would like to confirm the amiodarone dose.   Unable to verify dose per LDB note from today.   Will discuss with LDB and [facility] pharmacy.

## 2018-11-15 NOTE — Telephone Encounter
Spoke with [facility] who is needing Xarelto 15 mg daily refill sent. Rx sent as requested. No further questions or concerns at this time.

## 2018-11-20 ENCOUNTER — Encounter: Admit: 2018-11-20 | Discharge: 2018-11-20

## 2018-11-20 NOTE — Telephone Encounter
follow up regarding message from device team below  regarding increase in Right ventricular pacing:     device changes were made on 11/15/18    Cardiovascular Studies  His EKG is a paced first-degree AV block with a narrow QRS.  Repolarization changes are consistent with amiodarone effect.    His pacemaker is functioning normally.  He was at a low rate of 60 with a pacing 87% of the time and V pacing about 17% of the time.    I changed his low rate from 60-70.  We made minute ventilation and activity sensor both slightly more aggressive by changing their "factor" from 12-13.  I also changed his activity threshold for the accelerometer from low to very low.  He did a hall walk.  His heart rate went to 91 bpm and he felt fine but that is by no means definitive proof that we can fix his symptoms with more aggressive rate response.    > I called to see how Mr Begue was doing since changes today.  States that he has had a couple of episodes of lightheadedness.  sudden onset.  not related to shortness of breath.  does need to hold onto something or feels he might fall down.  BP elevated once (not correlated to episodes of LH) at 160/100,  states otherwise his BP has been around 130/80.  he has not checked his pulse with these episodes.  all episodes have resolved with sitting down for a bit.    he has an appt with his PCP tomorrow.  states he wants to make sure he does not have another problem related to his inner ear or with his shortness of breath.      I will route this message to Dr Artist Beach for review/update and any further recommendations

## 2018-11-20 NOTE — Telephone Encounter
-----   Message from Buckner Malta sent at 11/20/2018  3:18 PM CDT -----  Regarding: RVP% Alert, LDB Patient  Good afternoon,    Right ventricular pacing of > 40%. Pacing was 41% between Nov 19, 2018 01:38 and Nov 20, 2018 01:38. Trends show an increase in RVP since the end of July. Please see attached. Thank you.

## 2018-11-22 ENCOUNTER — Encounter: Admit: 2018-11-22 | Discharge: 2018-11-22

## 2018-11-23 NOTE — Telephone Encounter
LDB: Remote alert for RV pacing > 40%.   Received: Today   Message Contents   [name], Kenneth Buchanan Cvm Nurse Ep    A Yellow Alert was reported by the device:     * Right ventricular pacing of > 40%. Pacing was 41% between Nov 22, 2018 01:38 and Nov 23, 2018 01:38.     Normal device function. RV pacing 36% since 11/15/18. No events. Current rhythm AP-VP 70 bpm. Increased V pacing since last OV. Some programming changes were made last OV. LDB increased lower rate from 60 to 70 bpm. Max track to 120 S/Buchanan AVD to 267ms., Accelerometer to Very low from low.

## 2018-11-29 ENCOUNTER — Encounter: Admit: 2018-11-29 | Discharge: 2018-11-29

## 2018-11-29 DIAGNOSIS — Z959 Presence of cardiac and vascular implant and graft, unspecified: Secondary | ICD-10-CM

## 2018-11-29 DIAGNOSIS — I480 Paroxysmal atrial fibrillation: Secondary | ICD-10-CM

## 2018-11-30 ENCOUNTER — Ambulatory Visit: Admit: 2018-11-29 | Discharge: 2018-11-30

## 2018-11-30 DIAGNOSIS — I495 Sick sinus syndrome: Principal | ICD-10-CM

## 2018-12-14 ENCOUNTER — Encounter: Admit: 2018-12-14 | Discharge: 2018-12-14

## 2018-12-21 ENCOUNTER — Encounter: Admit: 2018-12-21 | Discharge: 2018-12-21

## 2018-12-21 NOTE — Progress Notes
Request for the following medical records for purpose of continuity of care:   Has an appointment with LDB on 12/25/18    Please send most recent OV note and labs.    Please Fax to:  [facility] - [phone]  Dr. Artist Beach  Attention:  Richardson Landry, RN    Thank you

## 2018-12-25 ENCOUNTER — Encounter: Admit: 2018-12-25 | Discharge: 2018-12-25

## 2018-12-25 ENCOUNTER — Ambulatory Visit: Admit: 2018-12-25 | Discharge: 2018-12-26

## 2018-12-25 DIAGNOSIS — I10 Essential (primary) hypertension: Secondary | ICD-10-CM

## 2018-12-25 DIAGNOSIS — C4491 Basal cell carcinoma of skin, unspecified: Secondary | ICD-10-CM

## 2018-12-25 DIAGNOSIS — M1990 Unspecified osteoarthritis, unspecified site: Secondary | ICD-10-CM

## 2018-12-25 DIAGNOSIS — Z951 Presence of aortocoronary bypass graft: Secondary | ICD-10-CM

## 2018-12-25 DIAGNOSIS — Z79899 Other long term (current) drug therapy: Secondary | ICD-10-CM

## 2018-12-25 DIAGNOSIS — Z950 Presence of cardiac pacemaker: Secondary | ICD-10-CM

## 2018-12-25 DIAGNOSIS — I480 Paroxysmal atrial fibrillation: Secondary | ICD-10-CM

## 2018-12-25 DIAGNOSIS — H5440 Blindness, one eye, unspecified eye: Secondary | ICD-10-CM

## 2018-12-25 DIAGNOSIS — H9190 Unspecified hearing loss, unspecified ear: Secondary | ICD-10-CM

## 2018-12-25 DIAGNOSIS — C73 Malignant neoplasm of thyroid gland: Secondary | ICD-10-CM

## 2018-12-25 DIAGNOSIS — J101 Influenza due to other identified influenza virus with other respiratory manifestations: Secondary | ICD-10-CM

## 2018-12-25 DIAGNOSIS — I4891 Unspecified atrial fibrillation: Secondary | ICD-10-CM

## 2018-12-25 DIAGNOSIS — G2581 Restless legs syndrome: Secondary | ICD-10-CM

## 2018-12-25 DIAGNOSIS — I2510 Atherosclerotic heart disease of native coronary artery without angina pectoris: Secondary | ICD-10-CM

## 2018-12-25 DIAGNOSIS — I4819 Other persistent atrial fibrillation: Secondary | ICD-10-CM

## 2018-12-25 DIAGNOSIS — E785 Hyperlipidemia, unspecified: Secondary | ICD-10-CM

## 2018-12-25 DIAGNOSIS — I495 Sick sinus syndrome: Secondary | ICD-10-CM

## 2018-12-25 DIAGNOSIS — I472 Ventricular tachycardia: Secondary | ICD-10-CM

## 2018-12-25 DIAGNOSIS — K219 Gastro-esophageal reflux disease without esophagitis: Secondary | ICD-10-CM

## 2018-12-25 NOTE — Telephone Encounter
-----   Message from Franne Grip, RN sent at 12/21/2018  3:45 PM CDT -----  Regarding: ([place])  ESV on 09/08 - remote requested  Requested for patient to send remote over the weekend for ESV on 09/08

## 2018-12-25 NOTE — Patient Instructions
See you in the office in 2 months to discuss next steps for AF    Richardson Landry will call re getting some minor reprogramming done in [place] or [place]

## 2018-12-25 NOTE — Telephone Encounter
Remote transmission received and documented.

## 2018-12-26 ENCOUNTER — Encounter: Admit: 2018-12-26 | Discharge: 2018-12-26

## 2018-12-28 ENCOUNTER — Encounter: Admit: 2018-12-28 | Discharge: 2018-12-28

## 2018-12-28 ENCOUNTER — Ambulatory Visit: Admit: 2018-12-28 | Discharge: 2018-12-28

## 2018-12-28 DIAGNOSIS — Z950 Presence of cardiac pacemaker: Secondary | ICD-10-CM

## 2018-12-28 DIAGNOSIS — I10 Essential (primary) hypertension: Secondary | ICD-10-CM

## 2018-12-28 DIAGNOSIS — Z9861 Coronary angioplasty status: Secondary | ICD-10-CM

## 2018-12-28 DIAGNOSIS — I4819 Other persistent atrial fibrillation: Principal | ICD-10-CM

## 2018-12-28 DIAGNOSIS — I472 Ventricular tachycardia: Secondary | ICD-10-CM

## 2018-12-28 DIAGNOSIS — I495 Sick sinus syndrome: Secondary | ICD-10-CM

## 2018-12-28 DIAGNOSIS — Z951 Presence of aortocoronary bypass graft: Secondary | ICD-10-CM

## 2018-12-28 DIAGNOSIS — Z79899 Other long term (current) drug therapy: Secondary | ICD-10-CM

## 2018-12-28 DIAGNOSIS — I2510 Atherosclerotic heart disease of native coronary artery without angina pectoris: Secondary | ICD-10-CM

## 2019-01-03 ENCOUNTER — Encounter: Admit: 2019-01-03 | Discharge: 2019-01-03 | Payer: MEDICARE

## 2019-01-03 NOTE — Telephone Encounter
Returned phone call and had to leave VM. Asked what exactly pt is having done as it is not common to need CC for dental work and also asked if they are asking him to hold his blood thinner for it as I would also need to get permission for that. Left call back number.

## 2019-01-03 NOTE — Telephone Encounter
-----   Message from Louis Meckel, LPN sent at [id number]  1:07 PM CDT -----  Regarding: LDB- CC  VM on triage line from [facility] with Dr. Posey Pronto dental office [phone].  Michela Pitcher that he needs dental work done on 01-17-19.  Michela Pitcher that they need cardiac clearance faxed to [phone].

## 2019-01-28 ENCOUNTER — Encounter: Admit: 2019-01-28 | Discharge: 2019-01-28 | Payer: MEDICARE

## 2019-01-28 NOTE — Telephone Encounter
Spoke with the patient. He did not realize he was in Afib. He denies any palpitations, SOA, fatigue, swelling, weight gain, or dizziness. Pt does not check BP regularly. Encouraged pt to check BP more regularly. Pt states he feels totally fine. He is complaint with Xarelto 15mg . Amiodarone was discontinued ~2-2.5 months ago. He has an upcoming OV on 11/9 to discuss other AADs.

## 2019-01-28 NOTE — Telephone Encounter
Jacolyn Reedy, MD  Renne Crigler, RN    Caller: Unspecified (Today, 10:27 AM)           Sit tight        Will continue to monitor.

## 2019-02-18 ENCOUNTER — Encounter: Admit: 2019-02-18 | Discharge: 2019-02-18 | Payer: MEDICARE

## 2019-02-18 NOTE — Telephone Encounter
-----   Message from Mercy Riding sent at 02/18/2019  9:38 AM CST -----  Regarding: LDB: Remote alert for AF  Remote alert for AF. Pt remains in AF since 01/26/19. Avg V rate < 100 bpm.

## 2019-02-18 NOTE — Telephone Encounter
Update:      LDB- RC about Afib. Said that he feels fine and yes he is taking the Xarelto.     Received: Today   Message Contents   Louis Meckel, LPN  P Cvm Nurse Ep

## 2019-02-18 NOTE — Telephone Encounter
Called pt to assess if he is still asymptomatic. Left VM to call back. Pt has been in AF since 01/26/2019. Amiodarone was discontinued about 3.5 months ago. He is prescribed Xarelto. Pt has OV scheduled on 03/25/19 with LDB.

## 2019-02-20 ENCOUNTER — Encounter: Admit: 2019-02-20 | Discharge: 2019-02-20 | Payer: MEDICARE

## 2019-02-20 ENCOUNTER — Ambulatory Visit: Admit: 2019-02-20 | Discharge: 2019-02-20 | Payer: MEDICARE

## 2019-02-20 DIAGNOSIS — I10 Essential (primary) hypertension: Secondary | ICD-10-CM

## 2019-02-20 DIAGNOSIS — H7402 Tympanosclerosis, left ear: Secondary | ICD-10-CM

## 2019-02-20 DIAGNOSIS — C73 Malignant neoplasm of thyroid gland: Secondary | ICD-10-CM

## 2019-02-20 DIAGNOSIS — C4491 Basal cell carcinoma of skin, unspecified: Secondary | ICD-10-CM

## 2019-02-20 DIAGNOSIS — H6123 Impacted cerumen, bilateral: Secondary | ICD-10-CM

## 2019-02-20 DIAGNOSIS — Z950 Presence of cardiac pacemaker: Secondary | ICD-10-CM

## 2019-02-20 DIAGNOSIS — I4891 Unspecified atrial fibrillation: Secondary | ICD-10-CM

## 2019-02-20 DIAGNOSIS — E785 Hyperlipidemia, unspecified: Secondary | ICD-10-CM

## 2019-02-20 DIAGNOSIS — J101 Influenza due to other identified influenza virus with other respiratory manifestations: Secondary | ICD-10-CM

## 2019-02-20 DIAGNOSIS — G2581 Restless legs syndrome: Secondary | ICD-10-CM

## 2019-02-20 DIAGNOSIS — H9113 Presbycusis, bilateral: Secondary | ICD-10-CM

## 2019-02-20 DIAGNOSIS — I480 Paroxysmal atrial fibrillation: Secondary | ICD-10-CM

## 2019-02-20 DIAGNOSIS — H9190 Unspecified hearing loss, unspecified ear: Secondary | ICD-10-CM

## 2019-02-20 DIAGNOSIS — M1990 Unspecified osteoarthritis, unspecified site: Secondary | ICD-10-CM

## 2019-02-20 DIAGNOSIS — I4819 Other persistent atrial fibrillation: Secondary | ICD-10-CM

## 2019-02-20 DIAGNOSIS — K219 Gastro-esophageal reflux disease without esophagitis: Secondary | ICD-10-CM

## 2019-02-20 DIAGNOSIS — I2510 Atherosclerotic heart disease of native coronary artery without angina pectoris: Secondary | ICD-10-CM

## 2019-02-20 DIAGNOSIS — H5440 Blindness, one eye, unspecified eye: Secondary | ICD-10-CM

## 2019-02-20 NOTE — Progress Notes
Date of Service: 02/20/2019    Subjective:             Kenneth Buchanan is a 78 y.o. male.    History of Present Illness  [name] returns with a history of left myringosclerosis.  He is doing well, he has no complaints. No drainage or pain is noted.    Chief Complaint  Follow Up      PMH, SH, FH, allergies, and medications listed below have been reviewed.  Family History   Problem Relation Age of Onset   ? Sudden Cardiac Death Mother    ? Heart Attack Mother    ? Hypertension Brother      Allergies   Allergen Reactions   ? Amiodarone NAUSEA ONLY and SEE COMMENTS     Fatigue, general malaise   ? Valium [Diazepam] UNKNOWN and SEE COMMENTS     During a procedure something happened after getting IV Valium. Pt had to receive a shot b/c of it.     Surgical History:   Procedure Laterality Date   ? COLONOSCOPY  1980   ? CORONARY ARTERY BYPASS GRAFT  1992    CABG times 2   ? CORONARY STENT PLACEMENT  2010    stent times 1   ? RETINAL DETACHMENT REPAIR  2012    times 3   ? CARDIOVERSION  2013   ? CYSTECTOMY  04/2014    thyroid   ? THYROIDECTOMY Bilateral 08/06/2014    Performed by Adonis Housekeeper, MD at [facility] OR   ? DIRECT LARYNGOSCOPY N/A 08/06/2014    Performed by Adonis Housekeeper, MD at [facility] OR   ? RECURRENT LARYNGEAL NERVE MONITORING N/A 08/06/2014    Performed by Adonis Housekeeper, MD at [facility] OR   ? FLEXIBLE FIBEROPTIC LARYNGOSCOPY N/A 08/06/2014    Performed by Adonis Housekeeper, MD at [facility] OR   ? CENTRAL NECK DISSECTION N/A 08/06/2014    Performed by Adonis Housekeeper, MD at [facility] OR   ? Left Heart Catheterization With Ventriculogram Left 09/03/2015    Performed by Harley Alto, MD at [facility] CATH LAB   ? Coronary Angiography N/A 09/03/2015    Performed by Harley Alto, MD at [facility] CATH LAB   ? Possible Percutaneous Coronary Intervention N/A 09/03/2015    Performed by Harley Alto, MD at [facility] CATH LAB   ? PACEMAKER INSERTION  11/28/2017    [place]   ? INSERTION/ REPLACEMENT PERMANENT PACEMAKER WITH ATRIAL AND VENTRICULAR LEAD Left 11/28/2017 Performed by [name], MD at [facility] CVOR   ? REMOVAL CARDIAC EVENT RECORDER Left 11/28/2017    Performed by [name], MD at [facility] CVOR   ? EXTERNAL CARDIOVERSION N/A 11/28/2017    Performed by [name], MD at [facility] CVOR   ? ANGIOGRAPHY CORONARY ARTERY WITH LEFT HEART CATHETERIZATION N/A 10/08/2018    Performed by Laney Pastor, MD at [facility] CATH LAB   ? POSSIBLE PERCUTANEOUS CORONARY STENT PLACEMENT WITH ANGIOPLASTY N/A 10/08/2018    Performed by Laney Pastor, MD at [facility] CATH LAB     Social History     Socioeconomic History   ? Marital status: Married     Spouse name: Okey Regal   ? Number of children: 5   ? Years of education: Not on file   ? Highest education level: Not on file   Occupational History     Employer: RETIRED   Tobacco Use   ? Smoking status: Former Smoker  Packs/day: 3.50     Years: 20.00     Pack years: 70.00     Types: Cigarettes     Last attempt to quit: 04/16/1982     Years since quitting: 36.8   ? Smokeless tobacco: Never Used   Substance and Sexual Activity   ? Alcohol use: Yes     Comment: 2 drinks per week    ? Drug use: No   ? Sexual activity: Not on file   Other Topics Concern   ? Not on file   Social History Narrative   ? Not on file          Review of Systems   Constitutional: Negative.    HENT: Negative.    Eyes: Negative.    Respiratory: Negative.    Cardiovascular: Negative.    Gastrointestinal: Negative.    Endocrine: Negative.    Genitourinary: Negative.    Musculoskeletal: Negative.    Skin: Negative.    Allergic/Immunologic: Negative.    Neurological: Negative.    Hematological: Negative.    Psychiatric/Behavioral: Negative.          Objective:         ? acetaminophen (TYLENOL) 500 mg tablet Take 1,000 mg by mouth as Needed for Pain.   ? aflibercept(+) (EYLEA) 2 mg/0.05 mL intravitreal injection 2 mg by INTRAVITREAL route. Every 6 weeks   ? aspirin EC 81 mg tablet Take one tablet by mouth daily. Hold for 7 days, resume 8/27 ? atorvastatin (LIPITOR) 80 mg tablet TAKE 1 TABLET BY MOUTH EVERY DAY   ? levothyroxine (SYNTHROID) 150 mcg tablet Take one tablet by mouth daily 30 minutes before breakfast. Indications: a condition with low thyroid hormone levels   ? nitroglycerin (NITROSTAT) 0.4 mg tablet Place 1 tablet under tongue every 5 minutes as needed for Chest Pain. Max of 3 tablets, call 911.   ? rivaroxaban (XARELTO) 15 mg tablet Take one tablet by mouth daily with dinner. Take with food.   ? timolol XE(+) (TIMOPTIC-XR) 0.5 % ophthalmic gel Place 1 drop into or around eye(s) daily.     Vitals:    02/20/19 0952   BP: 124/86   BP Source: Arm, Left Upper   Patient Position: Sitting   Pulse: 96   Temp: 36.3 ?C (97.4 ?F)   Weight: 90.7 kg (200 lb)   Height: 185.4 cm (73)   PainSc: Four     Body mass index is 26.39 kg/m?Marland Kitchen     Physical Exam  Exam under microscopy reveals cerumen impaction that I cleaned out of both sides with a curette.  His right eardrum is normal.  His left eardrum has myringosclerosis along the anterior malleus handle extending superiorly along the anterior superior annulus.  It does not look its changed much over the past 10 to 11 months.      Neat Appearance: Yes  Oral Communication: Yes  Clear Voice: Yes    Neuro: Left: Right:   CN VII 1/6 1/6   CN 3,4,5,6 nl nl   CN 9,10,11,12 nl nl     Hearing grossly intact  Normal respiratory effort  No abdominal tenderness      Head/Face lesions? N   Ext ear/nose lesions? N   Spont Nyst N   Gaze Nystagmus N   Affect Abnormal N   Edema/extremity changes? N   EOM restriction? N   Orientation Abn N              Assessment and Plan:  [name] returns with a history of left myringosclerosis.  He is doing well, he has no complaints. No drainage or pain is noted.    Exam under microscopy reveals cerumen impaction that I cleaned out of both sides with a curette.  His right eardrum is normal.  His left eardrum has myringosclerosis along the anterior malleus handle extending superiorly along the anterior superior annulus.  It does not look its changed much over the past 10 to 11 months.    He has left myringosclerosis, it remained stable.  I will have him return to see me back as needed.           In the presence of Paulene Floor, MD,  I have taken down these notes, Astrid Divine, [employer]. 02/20/2019 11:15 AM

## 2019-02-26 ENCOUNTER — Encounter: Admit: 2019-02-26 | Discharge: 2019-02-26 | Payer: MEDICARE

## 2019-02-26 DIAGNOSIS — E7800 Pure hypercholesterolemia, unspecified: Secondary | ICD-10-CM

## 2019-02-26 DIAGNOSIS — I10 Essential (primary) hypertension: Secondary | ICD-10-CM

## 2019-02-26 DIAGNOSIS — I4819 Other persistent atrial fibrillation: Secondary | ICD-10-CM

## 2019-02-26 DIAGNOSIS — I25810 Atherosclerosis of coronary artery bypass graft(s) without angina pectoris: Secondary | ICD-10-CM

## 2019-02-26 DIAGNOSIS — I2510 Atherosclerotic heart disease of native coronary artery without angina pectoris: Secondary | ICD-10-CM

## 2019-02-26 MED ORDER — NITROGLYCERIN 0.4 MG SL SUBL
.4 mg | ORAL_TABLET | SUBLINGUAL | 3 refills | 9.00000 days | Status: AC | PRN
Start: 2019-02-26 — End: ?

## 2019-03-25 ENCOUNTER — Encounter: Admit: 2019-03-25 | Discharge: 2019-03-25 | Payer: MEDICARE

## 2019-03-25 ENCOUNTER — Ambulatory Visit: Admit: 2019-03-25 | Discharge: 2019-03-25 | Payer: MEDICARE

## 2019-03-25 DIAGNOSIS — I2510 Atherosclerotic heart disease of native coronary artery without angina pectoris: Secondary | ICD-10-CM

## 2019-03-25 DIAGNOSIS — E785 Hyperlipidemia, unspecified: Secondary | ICD-10-CM

## 2019-03-25 DIAGNOSIS — I10 Essential (primary) hypertension: Secondary | ICD-10-CM

## 2019-03-25 DIAGNOSIS — Z951 Presence of aortocoronary bypass graft: Secondary | ICD-10-CM

## 2019-03-25 DIAGNOSIS — I4891 Unspecified atrial fibrillation: Secondary | ICD-10-CM

## 2019-03-25 DIAGNOSIS — C73 Malignant neoplasm of thyroid gland: Secondary | ICD-10-CM

## 2019-03-25 DIAGNOSIS — Z950 Presence of cardiac pacemaker: Secondary | ICD-10-CM

## 2019-03-25 DIAGNOSIS — I472 Ventricular tachycardia: Secondary | ICD-10-CM

## 2019-03-25 DIAGNOSIS — H9190 Unspecified hearing loss, unspecified ear: Secondary | ICD-10-CM

## 2019-03-25 DIAGNOSIS — I25810 Atherosclerosis of coronary artery bypass graft(s) without angina pectoris: Secondary | ICD-10-CM

## 2019-03-25 DIAGNOSIS — K219 Gastro-esophageal reflux disease without esophagitis: Secondary | ICD-10-CM

## 2019-03-25 DIAGNOSIS — I480 Paroxysmal atrial fibrillation: Secondary | ICD-10-CM

## 2019-03-25 DIAGNOSIS — I4819 Other persistent atrial fibrillation: Secondary | ICD-10-CM

## 2019-03-25 DIAGNOSIS — M1990 Unspecified osteoarthritis, unspecified site: Secondary | ICD-10-CM

## 2019-03-25 DIAGNOSIS — G2581 Restless legs syndrome: Secondary | ICD-10-CM

## 2019-03-25 DIAGNOSIS — J101 Influenza due to other identified influenza virus with other respiratory manifestations: Secondary | ICD-10-CM

## 2019-03-25 DIAGNOSIS — H5440 Blindness, one eye, unspecified eye: Secondary | ICD-10-CM

## 2019-03-25 DIAGNOSIS — C4491 Basal cell carcinoma of skin, unspecified: Secondary | ICD-10-CM

## 2019-03-25 MED ORDER — RIVAROXABAN 20 MG PO TAB
20 mg | ORAL_TABLET | Freq: Every day | ORAL | 3 refills | 30.00000 days | Status: DC
Start: 2019-03-25 — End: 2019-08-14

## 2019-04-22 ENCOUNTER — Encounter: Admit: 2019-04-22 | Discharge: 2019-04-22 | Payer: MEDICARE

## 2019-04-22 NOTE — Progress Notes
Kenneth Buchanan  P Cvm Nurse Ep            Alert for VT episode duration was 12 sec. V rate 142 bpm - Hx permanent Afib looks like Afib with RVR overall V rates appear to be controlled not sure if follow-up is needed looked like there were some med changes at recent OV        Reviewed, will continue to monitor.

## 2019-04-30 ENCOUNTER — Encounter: Admit: 2019-04-30 | Discharge: 2019-04-30 | Payer: MEDICARE

## 2019-04-30 MED ORDER — METOPROLOL SUCCINATE 25 MG PO TB24
25 mg | ORAL_TABLET | Freq: Every day | ORAL | 1 refills | 90.00000 days | Status: DC
Start: 2019-04-30 — End: 2019-09-17

## 2019-04-30 NOTE — Telephone Encounter
Called and spoke with patient. Informed patient about having afib on PPM and rates being 140's-170's. Told patient Dr. Artist Beach reviewed and would like patient to start metoprolol XL 25 mg daily. Educated use, frequency, dose. Patient verbalized understanding, no further questions.

## 2019-04-30 NOTE — Telephone Encounter
-----   Message from Jacolyn Reedy, MD sent at 04/27/2019 10:52 AM CST -----  Regarding: RE: VT alert  Please start Toprol XL 25mg /d  THX  ----- Message -----  From: Arminda Resides, BSN  Sent: 04/26/2019  12:07 PM CST  To: Jacolyn Reedy, MD  Subject: FW: VT alert                                     Need to add rate control? Any other changes?    Assessment and Plan  At this point I think we leave him in A. Fib since he is completely asymptomatic.    I think we could potentially reinitiate amiodarone with a lower maintenance dose and and get him to tolerate it, but I doubt it will come to that.  I would probably consider dofetilide to be our best choice in terms of a membrane active drug.  I would avoid the 1C antiarrhythmics given his coronary disease.  Dronedarone and sotalol are possibilities but both would tend to slow his rate.  Dronedarone is not terribly effective.    I have asked him to take rivaroxaban 20 mg daily rather than 15 mg daily now that he is off the amiodarone he is not on any rate control medication.  He is also not on any lipid medication.  His lipids are managed by Dr. Jaclyn Shaggy.    I anticipate seeing him back in about 6 months to reassess rate control, percent pacing etc.  ----- Message -----  From: Tana Conch  Sent: 04/26/2019   9:29 AM CST  To: Cvm Nurse Ep  Subject: VT alert                                         Pt alerted remotely today for VT episode. Episode appears to be likely Afib with RVR as patient is programmed VVI due to Afib. V rate was from 140-173 bpm.  Complete report in chart for further detail/review.  Follow up as needed.      Thank you,  Terri/Device Team

## 2019-05-08 NOTE — Progress Notes
--  Jacolyn Reedy, MD      agree     ----- Message -----   From: Lia Foyer, RN   Sent: 05/08/2019 11:01 AM CST   To: Jacolyn Reedy, MD   Subject: FW: Remote Results - LDB               RV pacing at 42%, down from 100% at last OV in 03/2019. Continue to monitor?     Thanks,   Jinny Blossom, RN       ----- Message -----   From: Ayesha Rumpf, RN   Sent: 05/08/2019  8:11 AM CST   To: Cvm Nurse Ep   Subject: Remote Results - LDB                 Transmission from today reviewed for an alert for Right ventricular pacing of > 40%. Pacing was 42% between May 07, 2019 01:39 and May 08, 2019 01:40. RV pacing percentage trends graph shows a fluctuation with an increase that correlate to an increase in ventricular rates. Patient was started on metoprolol succinate 25 mg on 1/12. Patient has been on this for a week now. Probably OK to continue to watch. Thank you.

## 2019-05-12 ENCOUNTER — Encounter: Admit: 2019-05-12 | Discharge: 2019-05-12 | Payer: MEDICARE

## 2019-05-12 DIAGNOSIS — E7849 Other hyperlipidemia: Secondary | ICD-10-CM

## 2019-05-12 DIAGNOSIS — E890 Postprocedural hypothyroidism: Secondary | ICD-10-CM

## 2019-05-12 MED ORDER — ATORVASTATIN 80 MG PO TAB
ORAL_TABLET | Freq: Every day | 0 refills | Status: DC
Start: 2019-05-12 — End: 2019-08-05

## 2019-05-13 MED ORDER — LEVOTHYROXINE 150 MCG PO TAB
ORAL_TABLET | Freq: Every day | ORAL | 3 refills | 30.00000 days | Status: AC
Start: 2019-05-13 — End: ?

## 2019-05-17 ENCOUNTER — Encounter: Admit: 2019-05-17 | Discharge: 2019-05-17 | Payer: MEDICARE

## 2019-05-17 NOTE — Telephone Encounter
-----   Message from [place] sent at 05/17/2019  9:41 AM CST -----  Regarding: Pt of Dr Artist Beach  Alert for RV pacing >40% looks like pt was started on metoprolol to control rates recently please let us know if alerts should be programmed off

## 2019-05-17 NOTE — Telephone Encounter
Done on [facility] website

## 2019-05-22 ENCOUNTER — Encounter: Admit: 2019-05-22 | Discharge: 2019-05-22 | Payer: MEDICARE

## 2019-05-23 ENCOUNTER — Encounter: Admit: 2019-05-23 | Discharge: 2019-05-23 | Payer: MEDICARE

## 2019-05-23 ENCOUNTER — Ambulatory Visit: Admit: 2019-05-23 | Discharge: 2019-05-23 | Payer: MEDICARE

## 2019-05-23 DIAGNOSIS — Z950 Presence of cardiac pacemaker: Secondary | ICD-10-CM

## 2019-05-23 DIAGNOSIS — J101 Influenza due to other identified influenza virus with other respiratory manifestations: Secondary | ICD-10-CM

## 2019-05-23 DIAGNOSIS — I4891 Unspecified atrial fibrillation: Secondary | ICD-10-CM

## 2019-05-23 DIAGNOSIS — H9190 Unspecified hearing loss, unspecified ear: Secondary | ICD-10-CM

## 2019-05-23 DIAGNOSIS — C4491 Basal cell carcinoma of skin, unspecified: Secondary | ICD-10-CM

## 2019-05-23 DIAGNOSIS — I2510 Atherosclerotic heart disease of native coronary artery without angina pectoris: Secondary | ICD-10-CM

## 2019-05-23 DIAGNOSIS — C73 Malignant neoplasm of thyroid gland: Secondary | ICD-10-CM

## 2019-05-23 DIAGNOSIS — E785 Hyperlipidemia, unspecified: Secondary | ICD-10-CM

## 2019-05-23 DIAGNOSIS — E890 Postprocedural hypothyroidism: Secondary | ICD-10-CM

## 2019-05-23 DIAGNOSIS — I10 Essential (primary) hypertension: Secondary | ICD-10-CM

## 2019-05-23 DIAGNOSIS — G2581 Restless legs syndrome: Secondary | ICD-10-CM

## 2019-05-23 DIAGNOSIS — I4819 Other persistent atrial fibrillation: Secondary | ICD-10-CM

## 2019-05-23 DIAGNOSIS — H5440 Blindness, one eye, unspecified eye: Secondary | ICD-10-CM

## 2019-05-23 DIAGNOSIS — I480 Paroxysmal atrial fibrillation: Secondary | ICD-10-CM

## 2019-05-23 DIAGNOSIS — K219 Gastro-esophageal reflux disease without esophagitis: Secondary | ICD-10-CM

## 2019-05-23 DIAGNOSIS — M1990 Unspecified osteoarthritis, unspecified site: Secondary | ICD-10-CM

## 2019-05-23 LAB — THYROGLOBULIN & THYROGLOBULIN AB
Lab: 0.6 ng/mL — ABNORMAL LOW (ref 1.7–55.0)
Lab: 11 [IU]/mL (ref <116)

## 2019-05-23 LAB — TSH WITH FREE T4 REFLEX: Lab: 1.5 uU/mL (ref 0.35–5.00)

## 2019-05-23 NOTE — Progress Notes
Date of Service: 05/23/2019    Subjective:             Kenneth Buchanan is a 79 y.o. male.    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  Tumor board recommendations 08/25/2014 for synthroid suppression.  02/20/19 [place] per Dr Kirtland Bouchard: No evidence of locoregional tumor recurrence.   ?  Following?with Dr. Kirtland Bouchard in Endo. Last [place] 02/20/19 with no evidence of disease.    No?subjective lymphadenopathy, changes to weight, vision loss, headache, neck pain, odynophagia, otalgia.?Speech and swallow OK today. No masses.?No new masses. Has [place] T today. Had recent incr in synthroid by Dr. Kirtland Bouchard.  ?  Had cardioversion for Afib in June 2017.?Back in a fib Off amiodoarone. On Xarelto for prior angioplasty with bare metal stents. Pacemaker placed. Dealing with mac deg    Component 09/20/18   TSH 2.68      Ref Range & Units 03/21/18 1327 03/31/17 1555 01/14/16 0850 11/25/14 1620   Thyroglobulin Ab   <=1 iU/mL <1       Thyroglobulin   ng/mL 0.4Low   0.3Low  R, CM  0.3Low  R, CM  0.8Low  R, CM     no recent tgb panel       Review of Systems   Constitutional: Negative.    HENT: Negative.    Eyes: Negative.    Respiratory: Negative.    Cardiovascular: Negative.    Gastrointestinal: Negative.    Endocrine: Negative.    Genitourinary: Negative.    Musculoskeletal: Negative.    Skin: Negative.    Allergic/Immunologic: Negative.    Neurological: Negative.    Hematological: Negative.    Psychiatric/Behavioral: Negative.          Objective:         ? acetaminophen (TYLENOL) 500 mg tablet Take 1,000 mg by mouth as Needed for Pain.   ? aflibercept(+) (EYLEA) 2 mg/0.05 mL intravitreal injection 2 mg by INTRAVITREAL route. Every 6 weeks   ? aspirin EC 81 mg tablet Take one tablet by mouth daily. Hold for 7 days, resume 8/27 ? atorvastatin (LIPITOR) 80 mg tablet TAKE 1 TABLET BY MOUTH EVERY DAY   ? levothyroxine (SYNTHROID) 150 mcg tablet TAKE 1 TABLET BY MOUTH EVERY DAY 30 MINUTES BEFORE BREAKFAST   ? metoprolol XL (TOPROL XL) 25 mg extended release tablet Take one tablet by mouth daily.   ? nitroglycerin (NITROSTAT) 0.4 mg tablet Place one tablet under tongue every 5 minutes as needed for Chest Pain. Max of 3 tablets, call 911.   ? rivaroxaban (XARELTO) 20 mg tablet Take one tablet by mouth daily with dinner. Take with food.   ? timolol XE(+) (TIMOPTIC-XR) 0.5 % ophthalmic gel Place 1 drop into or around eye(s) daily.     Vitals:    05/23/19 1005   BP: 125/77   Pulse: 72   Weight: 90.7 kg (200 lb)   Height: 185.4 cm (73)   PainSc: Zero     Body mass index is 26.39 kg/m?Marland Kitchen     Physical Exam  Alert, NAD  Head: Normocephalic/atraumatic  Ears: AU Auricles without lesions, EAC clear AU, hearing grossly intact AU  Eyes: EOMs equal OU, PERRL, vision grossly intact OU, conjunctiva clear  Nose: Externally without lesions, septum midline,  no visible lesions via anterior rhinoscopy  OC/OP: Teeth in good repair, tongue movement and sensation intact, no mass or mucosal lesions in OC or OP visibly or palpably, tonsils symmetric  Larynx: Voice normal without stridor or sturgor, normal external landmarks  Neck: No neck mass or adenopathy, no palpable thyroid masses, no cutaneous changes, incision doing well  CNs: Facial sensation and movement intact bilaterally, shoulder shrug normal bilaterally         Assessment and Plan:  s/p sistrunk procedure with 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 Overall doing well.?Dr. Kirtland Bouchard managing thyroid hormone.  Recent US good and no recent tgb panel, missed gettign last draw Clinically and functionally stable and?NED today. <5 yrs out RTC in 18 months, sooner prn.?DR K ORDERING SURVEILLANCE   Persistent Afib and follows with cardiology, getting pacemaker.

## 2019-06-14 ENCOUNTER — Encounter: Admit: 2019-06-14 | Discharge: 2019-06-14 | Payer: MEDICARE

## 2019-06-14 DIAGNOSIS — I4819 Other persistent atrial fibrillation: Secondary | ICD-10-CM

## 2019-06-14 DIAGNOSIS — I480 Paroxysmal atrial fibrillation: Secondary | ICD-10-CM

## 2019-06-14 DIAGNOSIS — Z959 Presence of cardiac and vascular implant and graft, unspecified: Secondary | ICD-10-CM

## 2019-08-05 MED ORDER — ATORVASTATIN 80 MG PO TAB
ORAL_TABLET | Freq: Every day | 3 refills | Status: AC
Start: 2019-08-05 — End: ?

## 2019-08-14 MED ORDER — XARELTO 20 MG PO TAB
ORAL_TABLET | Freq: Every day | ORAL | 3 refills | 30.00000 days | Status: AC
Start: 2019-08-14 — End: ?

## 2019-08-29 ENCOUNTER — Encounter: Admit: 2019-08-29 | Discharge: 2019-08-29 | Payer: MEDICARE

## 2019-09-17 ENCOUNTER — Encounter: Admit: 2019-09-17 | Discharge: 2019-09-17 | Payer: MEDICARE

## 2019-09-17 MED ORDER — METOPROLOL SUCCINATE 25 MG PO TB24
25 mg | ORAL_TABLET | Freq: Every day | ORAL | 3 refills | 90.00000 days | Status: AC
Start: 2019-09-17 — End: ?

## 2019-09-18 ENCOUNTER — Encounter: Admit: 2019-09-18 | Discharge: 2019-09-18 | Payer: MEDICARE

## 2019-09-18 DIAGNOSIS — Z950 Presence of cardiac pacemaker: Secondary | ICD-10-CM

## 2019-09-23 ENCOUNTER — Ambulatory Visit: Admit: 2019-09-23 | Discharge: 2019-09-23 | Payer: MEDICARE

## 2019-09-23 ENCOUNTER — Encounter: Admit: 2019-09-23 | Discharge: 2019-09-23 | Payer: MEDICARE

## 2019-09-23 DIAGNOSIS — Z950 Presence of cardiac pacemaker: Secondary | ICD-10-CM

## 2019-09-23 DIAGNOSIS — I4819 Other persistent atrial fibrillation: Secondary | ICD-10-CM

## 2019-09-23 DIAGNOSIS — I10 Essential (primary) hypertension: Secondary | ICD-10-CM

## 2019-09-23 DIAGNOSIS — I2510 Atherosclerotic heart disease of native coronary artery without angina pectoris: Secondary | ICD-10-CM

## 2019-09-23 DIAGNOSIS — I4891 Unspecified atrial fibrillation: Secondary | ICD-10-CM

## 2019-09-23 DIAGNOSIS — E785 Hyperlipidemia, unspecified: Secondary | ICD-10-CM

## 2019-09-23 DIAGNOSIS — C73 Malignant neoplasm of thyroid gland: Secondary | ICD-10-CM

## 2019-09-23 DIAGNOSIS — I472 Ventricular tachycardia: Secondary | ICD-10-CM

## 2019-09-23 DIAGNOSIS — Z7901 Long term (current) use of anticoagulants: Secondary | ICD-10-CM

## 2019-09-23 DIAGNOSIS — C4491 Basal cell carcinoma of skin, unspecified: Secondary | ICD-10-CM

## 2019-09-23 DIAGNOSIS — I25810 Atherosclerosis of coronary artery bypass graft(s) without angina pectoris: Secondary | ICD-10-CM

## 2019-09-23 DIAGNOSIS — M1990 Unspecified osteoarthritis, unspecified site: Secondary | ICD-10-CM

## 2019-09-23 DIAGNOSIS — H9190 Unspecified hearing loss, unspecified ear: Secondary | ICD-10-CM

## 2019-09-23 DIAGNOSIS — Z951 Presence of aortocoronary bypass graft: Secondary | ICD-10-CM

## 2019-09-23 DIAGNOSIS — I480 Paroxysmal atrial fibrillation: Secondary | ICD-10-CM

## 2019-09-23 DIAGNOSIS — H5440 Blindness, one eye, unspecified eye: Secondary | ICD-10-CM

## 2019-09-23 DIAGNOSIS — J101 Influenza due to other identified influenza virus with other respiratory manifestations: Secondary | ICD-10-CM

## 2019-09-23 DIAGNOSIS — G2581 Restless legs syndrome: Secondary | ICD-10-CM

## 2019-09-23 DIAGNOSIS — K219 Gastro-esophageal reflux disease without esophagitis: Secondary | ICD-10-CM

## 2019-09-23 NOTE — Assessment & Plan Note
-   BP at today's visit: 136/92    Plan:  > Continue Metoprolol 25 mg daily

## 2019-09-23 NOTE — Assessment & Plan Note
-  Patient continues to have episodes of atrial fibrillation per pacemaker interrogation and EKG at today's visit.  Patient. however, remains asymptomatic    Plan:  > Continue metoprolol 25 mg daily.  Unable to increase this dose as likely need to an increase in ventricular pacing  > We will continue to hold amiodarone given patient remaining asymptomatic  > Continue Xarelto 20 mg daily

## 2019-11-13 ENCOUNTER — Encounter: Admit: 2019-11-13 | Discharge: 2019-11-13 | Payer: MEDICARE

## 2019-11-13 ENCOUNTER — Ambulatory Visit: Admit: 2019-11-13 | Discharge: 2019-11-14 | Payer: MEDICARE

## 2019-11-13 DIAGNOSIS — I4891 Unspecified atrial fibrillation: Secondary | ICD-10-CM

## 2019-11-13 DIAGNOSIS — C73 Malignant neoplasm of thyroid gland: Secondary | ICD-10-CM

## 2019-11-13 DIAGNOSIS — I10 Essential (primary) hypertension: Secondary | ICD-10-CM

## 2019-11-13 DIAGNOSIS — K219 Gastro-esophageal reflux disease without esophagitis: Secondary | ICD-10-CM

## 2019-11-13 DIAGNOSIS — G2581 Restless legs syndrome: Secondary | ICD-10-CM

## 2019-11-13 DIAGNOSIS — E785 Hyperlipidemia, unspecified: Secondary | ICD-10-CM

## 2019-11-13 DIAGNOSIS — I480 Paroxysmal atrial fibrillation: Secondary | ICD-10-CM

## 2019-11-13 DIAGNOSIS — M1990 Unspecified osteoarthritis, unspecified site: Secondary | ICD-10-CM

## 2019-11-13 DIAGNOSIS — J101 Influenza due to other identified influenza virus with other respiratory manifestations: Secondary | ICD-10-CM

## 2019-11-13 DIAGNOSIS — Z950 Presence of cardiac pacemaker: Secondary | ICD-10-CM

## 2019-11-13 DIAGNOSIS — H5440 Blindness, one eye, unspecified eye: Secondary | ICD-10-CM

## 2019-11-13 DIAGNOSIS — C4491 Basal cell carcinoma of skin, unspecified: Secondary | ICD-10-CM

## 2019-11-13 DIAGNOSIS — H9190 Unspecified hearing loss, unspecified ear: Secondary | ICD-10-CM

## 2019-11-13 DIAGNOSIS — I2510 Atherosclerotic heart disease of native coronary artery without angina pectoris: Secondary | ICD-10-CM

## 2019-11-13 DIAGNOSIS — I4819 Other persistent atrial fibrillation: Secondary | ICD-10-CM

## 2019-11-13 DIAGNOSIS — E890 Postprocedural hypothyroidism: Secondary | ICD-10-CM

## 2019-11-13 NOTE — Patient Instructions
It was great to see you today.    Issues we are addressing:    Our plan:    We will contact you with testing results via phone.  If you are signed up for my chart, we will release results to you electronically.    ? Please call my nurse between appointments with any issues.  ? Please call my nurse if you are having any issues scheduling your appointment.  ? Please let me or my nurse know if any results are unclear; I will call you personally to discuss further.      If you had to wait on me today, my sincerest apologies.  My goal in every clinic is to run right on time; however, on occasion, I get behind in clinic due to unexpected issues with patients.    Your satisfaction with the care you received today is of utmost importance.  Please contact me directly via MyChart if there are any issues.    Take care,   Louie [place], [place]  [phone]

## 2019-11-14 ENCOUNTER — Encounter: Admit: 2019-11-14 | Discharge: 2019-11-14 | Payer: MEDICARE

## 2019-11-14 DIAGNOSIS — H9190 Unspecified hearing loss, unspecified ear: Secondary | ICD-10-CM

## 2019-11-14 DIAGNOSIS — C73 Malignant neoplasm of thyroid gland: Secondary | ICD-10-CM

## 2019-11-14 DIAGNOSIS — K219 Gastro-esophageal reflux disease without esophagitis: Secondary | ICD-10-CM

## 2019-11-14 DIAGNOSIS — E785 Hyperlipidemia, unspecified: Secondary | ICD-10-CM

## 2019-11-14 DIAGNOSIS — Z950 Presence of cardiac pacemaker: Secondary | ICD-10-CM

## 2019-11-14 DIAGNOSIS — C4491 Basal cell carcinoma of skin, unspecified: Secondary | ICD-10-CM

## 2019-11-14 DIAGNOSIS — H5440 Blindness, one eye, unspecified eye: Secondary | ICD-10-CM

## 2019-11-14 DIAGNOSIS — I4819 Other persistent atrial fibrillation: Secondary | ICD-10-CM

## 2019-11-14 DIAGNOSIS — J101 Influenza due to other identified influenza virus with other respiratory manifestations: Secondary | ICD-10-CM

## 2019-11-14 DIAGNOSIS — I4891 Unspecified atrial fibrillation: Secondary | ICD-10-CM

## 2019-11-14 DIAGNOSIS — I2510 Atherosclerotic heart disease of native coronary artery without angina pectoris: Secondary | ICD-10-CM

## 2019-11-14 DIAGNOSIS — M1990 Unspecified osteoarthritis, unspecified site: Secondary | ICD-10-CM

## 2019-11-14 DIAGNOSIS — G2581 Restless legs syndrome: Secondary | ICD-10-CM

## 2019-11-14 DIAGNOSIS — I10 Essential (primary) hypertension: Secondary | ICD-10-CM

## 2019-11-14 DIAGNOSIS — I480 Paroxysmal atrial fibrillation: Secondary | ICD-10-CM

## 2019-11-29 ENCOUNTER — Encounter: Admit: 2019-11-29 | Discharge: 2019-11-29 | Payer: MEDICARE

## 2020-01-24 IMAGING — CR CHEST
4 series · 4 of 4 positions shown · non-contrast
Comparison: none

[shoulder external]
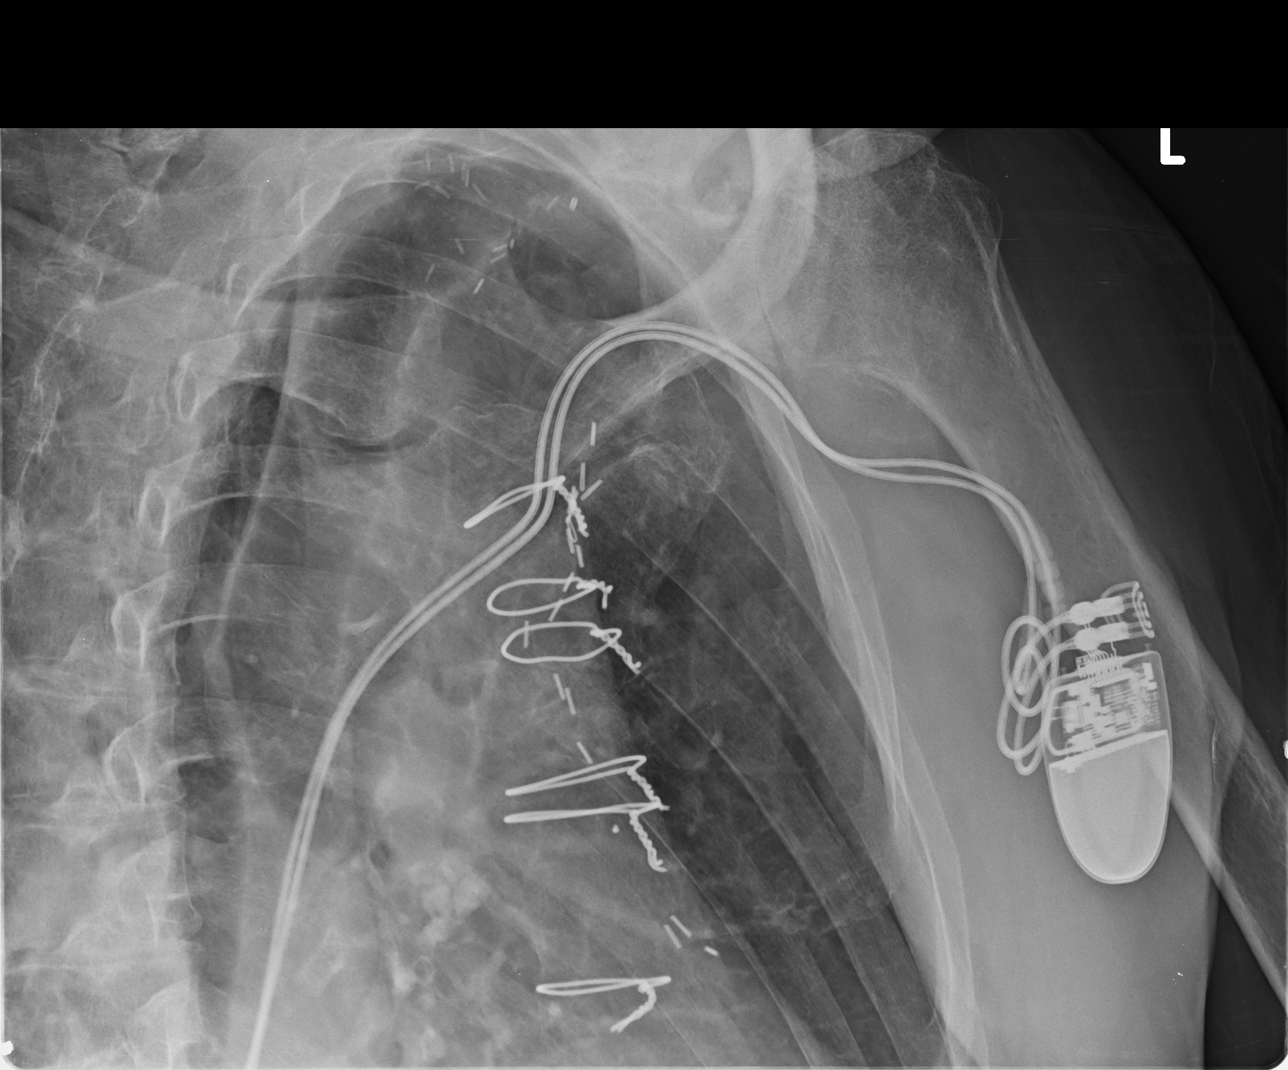

[shoulder internal]
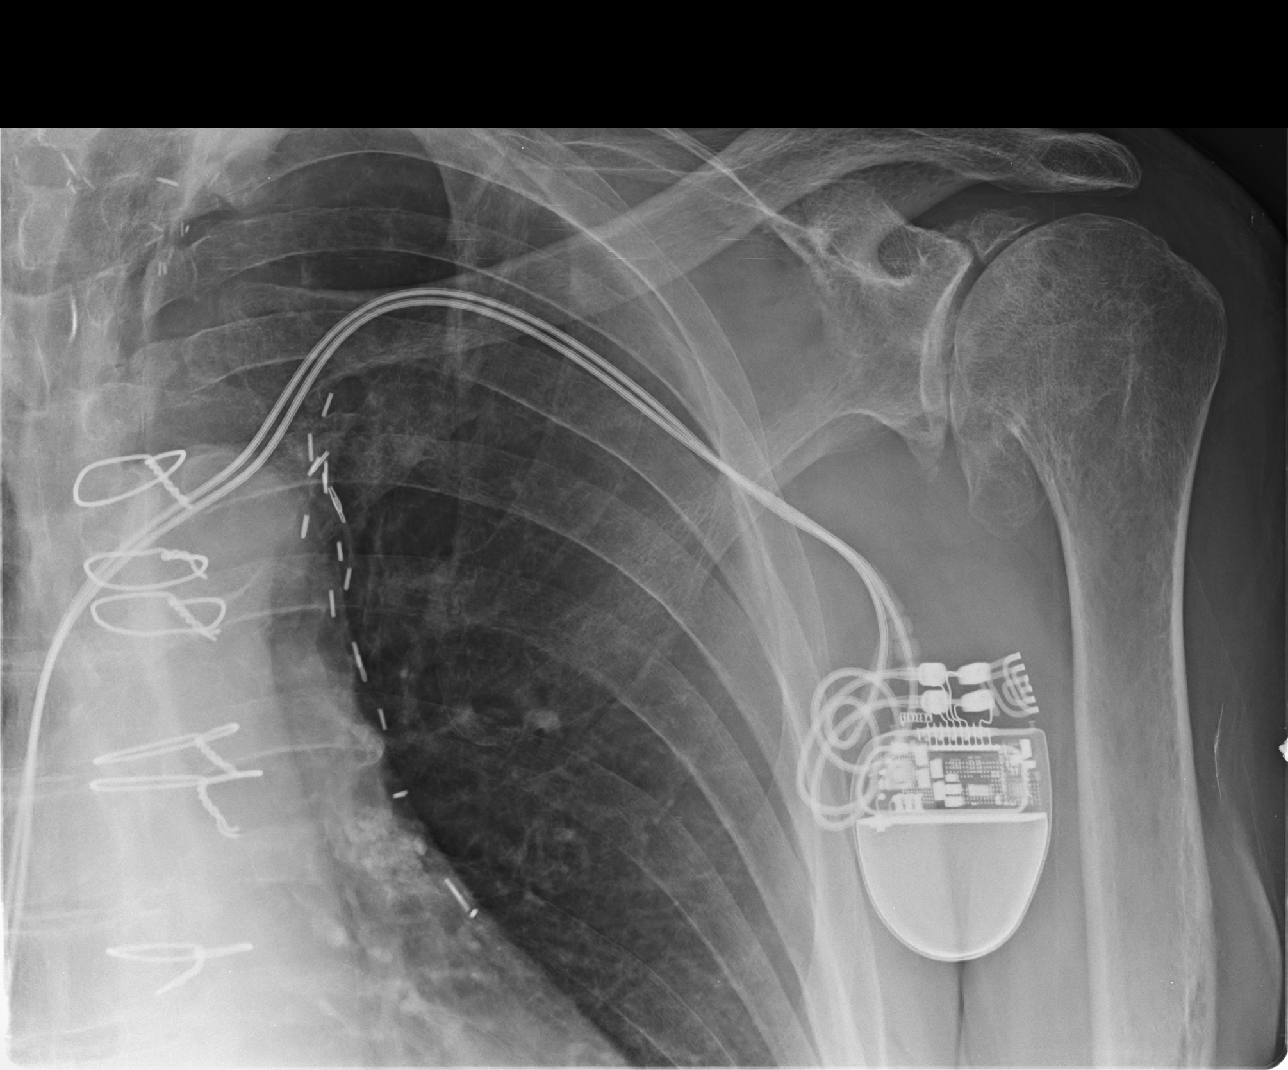

[shoulder y-view]
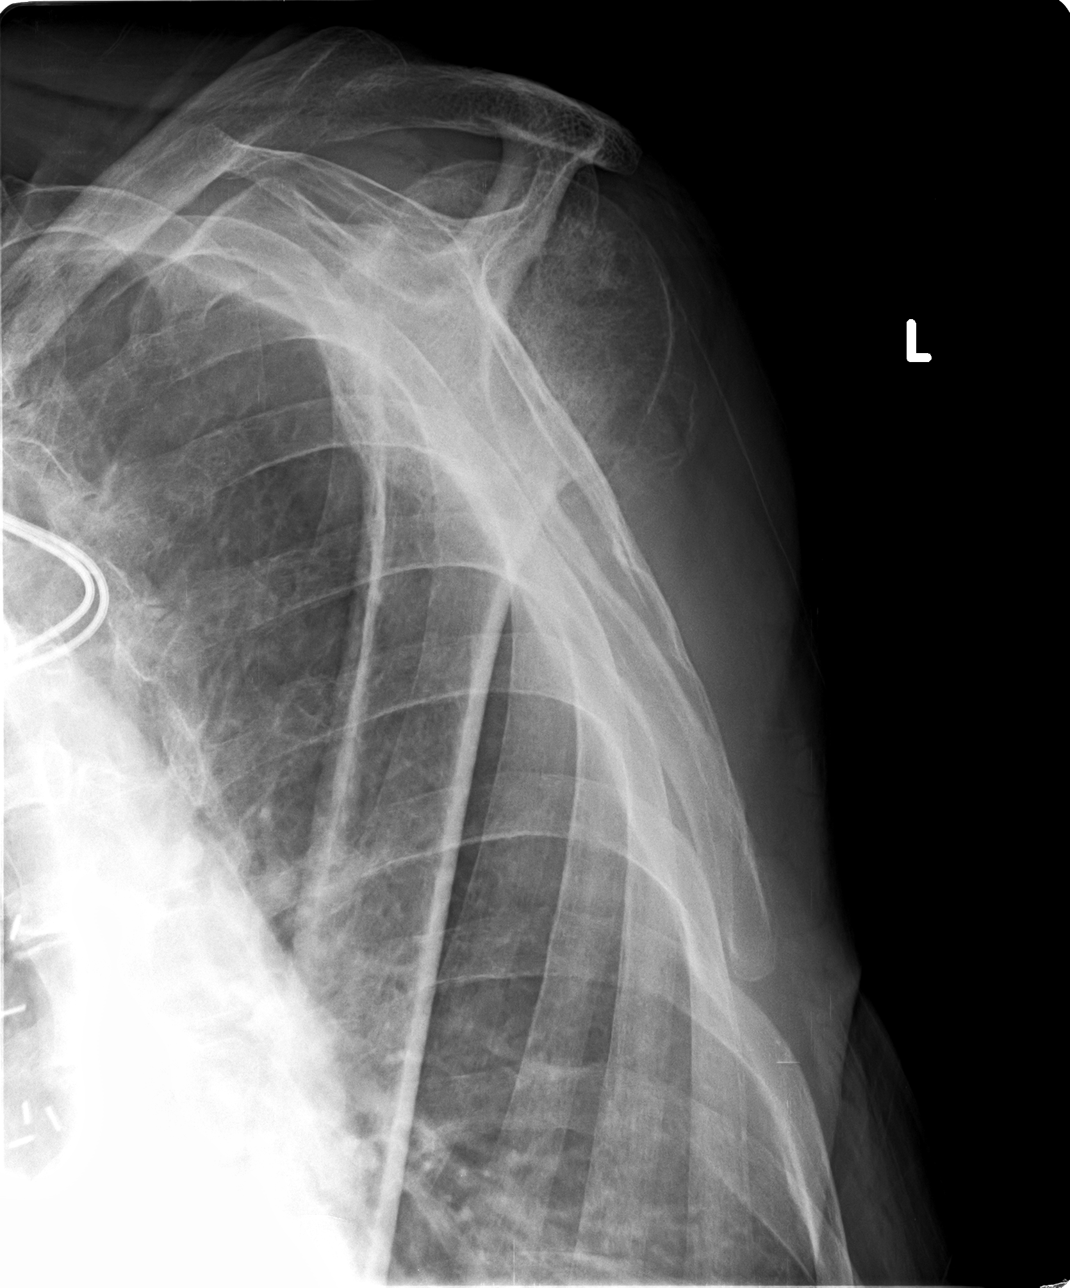

[shoulder axillary]
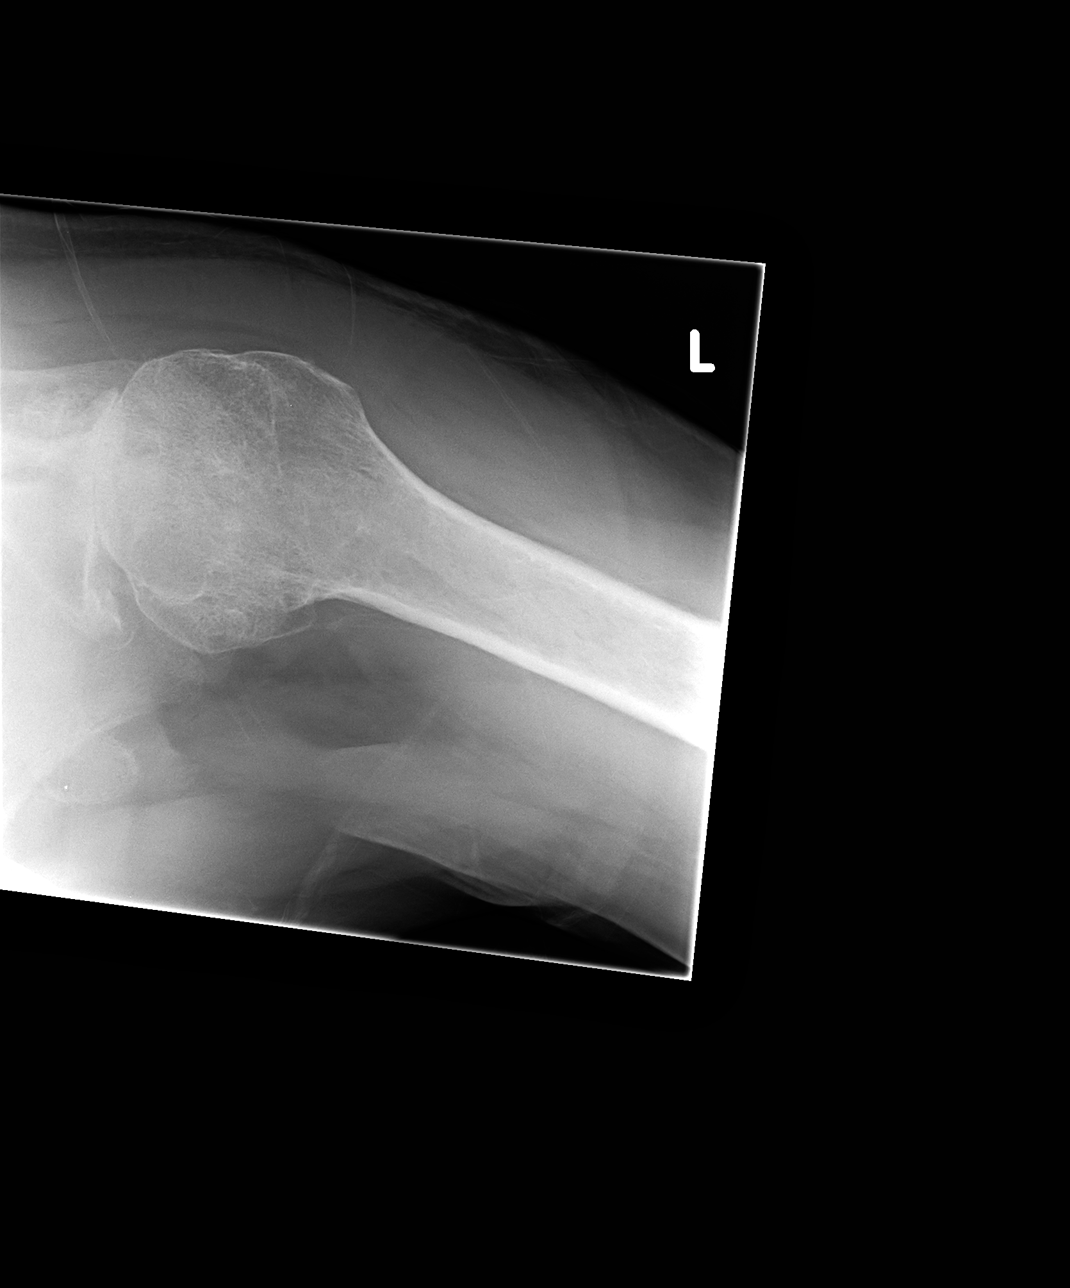

[4 of 4 positions shown; findings below may reference images not displayed]

DIAGNOSTIC STUDIES

EXAM

Left shoulder radiographs.

INDICATION

shoulder pain
pain lt shoulder, jl

TECHNIQUE

Four views of the left shoulder.

COMPARISONS

None.

FINDINGS

Severe glenohumeral osteoarthropathy with bulky marginal spurring, particularly inferiorly. There
is also ossification at the superior glenohumeral articulation with corticated margins, likely
sequela of old trauma, though an intra-articular calcification, osteochondromatosis is possible.
There is mild acromioclavicular osteoarthropathy. Diffuse bone demineralization. Dual lead cardiac
pacemaker. Median sternotomy wires with evidence of CABG. No acute displaced fracture.

IMPRESSION

Severe glenohumeral and mild acromioclavicular osteoarthropathy. No compelling evidence of acute
osseous abnormality.

Tech Notes:

pain lt shoulder, jl

## 2020-02-27 ENCOUNTER — Encounter: Admit: 2020-02-27 | Discharge: 2020-02-27 | Payer: MEDICARE

## 2020-05-06 ENCOUNTER — Encounter: Admit: 2020-05-06 | Discharge: 2020-05-06 | Payer: MEDICARE

## 2020-05-06 DIAGNOSIS — E890 Postprocedural hypothyroidism: Secondary | ICD-10-CM

## 2020-05-06 MED ORDER — LEVOTHYROXINE 150 MCG PO TAB
150 ug | ORAL_TABLET | Freq: Every day | ORAL | 0 refills | 30.00000 days | Status: AC
Start: 2020-05-06 — End: ?

## 2020-05-07 ENCOUNTER — Encounter: Admit: 2020-05-07 | Discharge: 2020-05-07 | Payer: MEDICARE

## 2020-06-18 ENCOUNTER — Encounter: Admit: 2020-06-18 | Discharge: 2020-06-18 | Payer: MEDICARE

## 2020-06-18 NOTE — Telephone Encounter
CMN received from [facility] requesting signature for cpap supplies      Pt last seen 2020    MA sent fax back to DME with note that patient has not been seen since 2020 and has no f/u scheduled.

## 2020-06-24 ENCOUNTER — Encounter: Admit: 2020-06-24 | Discharge: 2020-06-24 | Payer: MEDICARE

## 2020-06-24 DIAGNOSIS — Z959 Presence of cardiac and vascular implant and graft, unspecified: Secondary | ICD-10-CM

## 2020-06-24 DIAGNOSIS — Z950 Presence of cardiac pacemaker: Secondary | ICD-10-CM

## 2020-06-24 DIAGNOSIS — I495 Sick sinus syndrome: Secondary | ICD-10-CM

## 2020-06-24 NOTE — Telephone Encounter
-----   Message from Carles Collet, RN sent at 06/24/2020 10:19 AM CST -----  Regarding: Due for device check prior to OV (no orders)  Due for in-office check expected (last was 09/2019) please order/schedule, this patient is not scheduled for an annual with a provider either. Just schedule 30 minutes prior to OV.  Please follow-up with patient and scheduling    Thanks,    Jessica/Device team

## 2020-06-24 NOTE — Telephone Encounter
Patient is due for OV and in person device check. Message sent to scheduling team to reach out to patient to set these up.

## 2020-07-23 ENCOUNTER — Encounter: Admit: 2020-07-23 | Discharge: 2020-07-23 | Payer: MEDICARE

## 2020-07-23 NOTE — Telephone Encounter
Fax received from DME requesting chart notes after 08/2018 documenting pt is staying up to date regarding hid cpap.    DME informed patient hasn't been seen by out clinic since 04/27/2018

## 2020-07-25 ENCOUNTER — Encounter: Admit: 2020-07-25 | Discharge: 2020-07-25 | Payer: MEDICARE

## 2020-07-25 DIAGNOSIS — E7849 Other hyperlipidemia: Secondary | ICD-10-CM

## 2020-07-25 MED ORDER — ATORVASTATIN 80 MG PO TAB
ORAL_TABLET | Freq: Every day | 3 refills
Start: 2020-07-25 — End: ?

## 2020-07-25 MED ORDER — METOPROLOL SUCCINATE 25 MG PO TB24
ORAL_TABLET | Freq: Every day | 3 refills
Start: 2020-07-25 — End: ?

## 2020-07-26 ENCOUNTER — Encounter: Admit: 2020-07-26 | Discharge: 2020-07-26 | Payer: MEDICARE

## 2020-07-26 DIAGNOSIS — E785 Hyperlipidemia, unspecified: Secondary | ICD-10-CM

## 2020-07-30 ENCOUNTER — Encounter: Admit: 2020-07-30 | Discharge: 2020-07-30 | Payer: MEDICARE

## 2020-07-30 DIAGNOSIS — E890 Postprocedural hypothyroidism: Secondary | ICD-10-CM

## 2020-07-30 MED ORDER — LEVOTHYROXINE 150 MCG PO TAB
ORAL_TABLET | Freq: Every day | ORAL | 0 refills | 30.00000 days | Status: AC
Start: 2020-07-30 — End: ?

## 2020-07-30 NOTE — Telephone Encounter
Refill request for LT4  Pt has appt scheduled   Refilled per protocol

## 2020-08-31 ENCOUNTER — Encounter: Admit: 2020-08-31 | Discharge: 2020-08-31 | Payer: MEDICARE

## 2020-09-15 ENCOUNTER — Encounter: Admit: 2020-09-15 | Discharge: 2020-09-15 | Payer: MEDICARE

## 2020-09-16 ENCOUNTER — Encounter: Admit: 2020-09-16 | Discharge: 2020-09-16 | Payer: MEDICARE

## 2020-09-18 ENCOUNTER — Ambulatory Visit: Admit: 2020-09-18 | Discharge: 2020-09-18 | Payer: MEDICARE

## 2020-09-18 ENCOUNTER — Encounter: Admit: 2020-09-18 | Discharge: 2020-09-18 | Payer: MEDICARE

## 2020-09-18 DIAGNOSIS — C73 Malignant neoplasm of thyroid gland: Secondary | ICD-10-CM

## 2020-09-18 DIAGNOSIS — E785 Hyperlipidemia, unspecified: Secondary | ICD-10-CM

## 2020-09-18 DIAGNOSIS — E890 Postprocedural hypothyroidism: Secondary | ICD-10-CM

## 2020-09-18 LAB — THYROGLOBULIN & THYROGLOBULIN AB
ANTI THYROGLOB ANTI: 13 [IU]/mL (ref <116)
THYROGLOBULIN: 0.2 ng/mL — ABNORMAL LOW (ref 1.7–55.0)

## 2020-09-18 LAB — TSH WITH FREE T4 REFLEX: TSH: 0.6 uU/mL (ref 0.35–5.00)

## 2020-09-23 ENCOUNTER — Encounter: Admit: 2020-09-23 | Discharge: 2020-09-23 | Payer: MEDICARE

## 2020-09-23 NOTE — Telephone Encounter
I called the patient to discuss. He does not know why he fell, he thinks he might have tripped, but was not sure. We discussed that we will continue to monitor from our end. We discussed if any of his injuries become worse he will need to go to the ER, or he will need to call his PCP for further work up. Patient states understanding of plan.

## 2020-09-23 NOTE — Telephone Encounter
Left Vm to CB and discuss.

## 2020-09-23 NOTE — Telephone Encounter
-----   Message from Louis Meckel, LPN sent at 11/24/3732 11:26 AM CDT -----  Regarding: LDB- fell  VM on triage line from wife at 10:50am.  Michela Pitcher that at 1 pm yesterday he got out of car and took 3-4 steps to the back of the car and fell flat on his face.  He had no heart issues or warning that he was going to fall.  Could you check his device before and after 1pm yesterday.  Get results to LDB nurse, Team B.  I sent this to [facility].  Call him at [phone].

## 2020-09-23 NOTE — Telephone Encounter
At time of CB can provide the follow information:    Weber, Thomasene Lot Cvm Nurse Ep Team D  Good afternoon,     Patient called due to a fall yesterday. They sent in a transmission to assess for further info. There were no episodes yesterday or signs of device dysfunction. They did have a longer NSVT alongside 3 other NSVTs.     Episode on 09/07/2020 is 9 seconds at 270bpm. EGM shows VT but with some possible noise or picking up of outside signals. Episode is 9 seconds. Spoke with tech services about this episode.     Patient wanted follow up about check and possible Dr. Artist Beach thoughts on the matter. Please see attached. Thank you.

## 2020-10-01 ENCOUNTER — Encounter: Admit: 2020-10-01 | Discharge: 2020-10-01 | Payer: MEDICARE

## 2020-10-01 DIAGNOSIS — I4819 Other persistent atrial fibrillation: Secondary | ICD-10-CM

## 2020-10-01 MED ORDER — XARELTO 20 MG PO TAB
ORAL_TABLET | Freq: Every day | ORAL | 0 refills | 30.00000 days | Status: AC
Start: 2020-10-01 — End: ?

## 2020-10-05 ENCOUNTER — Encounter: Admit: 2020-10-05 | Discharge: 2020-10-05 | Payer: MEDICARE

## 2020-10-13 ENCOUNTER — Encounter: Admit: 2020-10-13 | Discharge: 2020-10-13 | Payer: MEDICARE

## 2020-10-13 ENCOUNTER — Ambulatory Visit: Admit: 2020-10-13 | Discharge: 2020-10-13 | Payer: MEDICARE

## 2020-10-13 DIAGNOSIS — R55 Syncope and collapse: Secondary | ICD-10-CM

## 2020-10-14 ENCOUNTER — Encounter: Admit: 2020-10-14 | Discharge: 2020-10-14 | Payer: MEDICARE

## 2020-10-16 ENCOUNTER — Encounter: Admit: 2020-10-16 | Discharge: 2020-10-16 | Payer: MEDICARE

## 2020-10-16 DIAGNOSIS — I4819 Other persistent atrial fibrillation: Secondary | ICD-10-CM

## 2020-10-16 DIAGNOSIS — I25810 Atherosclerosis of coronary artery bypass graft(s) without angina pectoris: Secondary | ICD-10-CM

## 2020-10-29 ENCOUNTER — Encounter: Admit: 2020-10-29 | Discharge: 2020-10-29 | Payer: MEDICARE

## 2020-10-29 DIAGNOSIS — E890 Postprocedural hypothyroidism: Secondary | ICD-10-CM

## 2020-10-29 DIAGNOSIS — E7849 Other hyperlipidemia: Secondary | ICD-10-CM

## 2020-10-29 MED ORDER — ATORVASTATIN 80 MG PO TAB
ORAL_TABLET | Freq: Every day | 0 refills | Status: AC
Start: 2020-10-29 — End: ?

## 2020-10-29 MED ORDER — LEVOTHYROXINE 150 MCG PO TAB
150 ug | ORAL_TABLET | Freq: Every day | ORAL | 1 refills | 30.00000 days | Status: AC
Start: 2020-10-29 — End: ?

## 2020-11-23 ENCOUNTER — Ambulatory Visit: Admit: 2020-11-23 | Discharge: 2020-11-23 | Payer: MEDICARE

## 2020-11-23 ENCOUNTER — Encounter: Admit: 2020-11-23 | Discharge: 2020-11-23 | Payer: MEDICARE

## 2020-11-23 DIAGNOSIS — I4819 Other persistent atrial fibrillation: Secondary | ICD-10-CM

## 2020-11-23 DIAGNOSIS — C73 Malignant neoplasm of thyroid gland: Secondary | ICD-10-CM

## 2020-11-23 DIAGNOSIS — E890 Postprocedural hypothyroidism: Secondary | ICD-10-CM

## 2020-11-23 DIAGNOSIS — I2510 Atherosclerotic heart disease of native coronary artery without angina pectoris: Secondary | ICD-10-CM

## 2020-11-23 DIAGNOSIS — I4891 Unspecified atrial fibrillation: Secondary | ICD-10-CM

## 2020-11-23 DIAGNOSIS — I10 Essential (primary) hypertension: Secondary | ICD-10-CM

## 2020-11-23 DIAGNOSIS — K219 Gastro-esophageal reflux disease without esophagitis: Secondary | ICD-10-CM

## 2020-11-23 DIAGNOSIS — C4491 Basal cell carcinoma of skin, unspecified: Secondary | ICD-10-CM

## 2020-11-23 DIAGNOSIS — G2581 Restless legs syndrome: Secondary | ICD-10-CM

## 2020-11-23 DIAGNOSIS — M1990 Unspecified osteoarthritis, unspecified site: Secondary | ICD-10-CM

## 2020-11-23 DIAGNOSIS — Z950 Presence of cardiac pacemaker: Secondary | ICD-10-CM

## 2020-11-23 DIAGNOSIS — H5440 Blindness, one eye, unspecified eye: Secondary | ICD-10-CM

## 2020-11-23 DIAGNOSIS — J101 Influenza due to other identified influenza virus with other respiratory manifestations: Secondary | ICD-10-CM

## 2020-11-23 DIAGNOSIS — E785 Hyperlipidemia, unspecified: Secondary | ICD-10-CM

## 2020-11-23 DIAGNOSIS — I480 Paroxysmal atrial fibrillation: Secondary | ICD-10-CM

## 2020-11-23 DIAGNOSIS — H9190 Unspecified hearing loss, unspecified ear: Secondary | ICD-10-CM

## 2020-11-24 ENCOUNTER — Encounter: Admit: 2020-11-24 | Discharge: 2020-11-24 | Payer: MEDICARE

## 2020-11-24 NOTE — Telephone Encounter
Called and verified patient name and DOB. [place] did not show any signs of recurrence. We will follow up in 1 year as scheduled.

## 2020-11-25 ENCOUNTER — Encounter: Admit: 2020-11-25 | Discharge: 2020-11-25 | Payer: MEDICARE

## 2020-11-25 ENCOUNTER — Ambulatory Visit: Admit: 2020-11-25 | Discharge: 2020-11-25 | Payer: MEDICARE

## 2020-11-25 DIAGNOSIS — I4819 Other persistent atrial fibrillation: Secondary | ICD-10-CM

## 2020-11-25 LAB — BASIC METABOLIC PANEL
CHLORIDE: 107 MMOL/L (ref 98–110)
CO2: 25 MMOL/L (ref 21–30)
GLUCOSE,PANEL: 96 mg/dL (ref 70–100)
POTASSIUM: 3.8 MMOL/L (ref 3.5–5.1)
SODIUM: 143 MMOL/L (ref 137–147)

## 2020-11-26 ENCOUNTER — Encounter: Admit: 2020-11-26 | Discharge: 2020-11-26 | Payer: MEDICARE

## 2020-12-20 ENCOUNTER — Encounter: Admit: 2020-12-20 | Discharge: 2020-12-20 | Payer: MEDICARE

## 2020-12-20 NOTE — Progress Notes
Request for the following medical records for purpose of continuity of care:   Has an appointment with LDB on 12/24/2020    Please send most recent OV note.    Please Fax to: [phone]  Cardiology Services at the [facility]   Dr. Artist Beach  Attention:  Erasmo Downer, RN    Thank you

## 2020-12-21 ENCOUNTER — Encounter: Admit: 2020-12-21 | Discharge: 2020-12-21 | Payer: MEDICARE

## 2020-12-21 MED ORDER — XARELTO 20 MG PO TAB
ORAL_TABLET | Freq: Every day | ORAL | 3 refills | 30.00000 days | Status: AC
Start: 2020-12-21 — End: ?

## 2020-12-24 ENCOUNTER — Ambulatory Visit: Admit: 2020-12-24 | Discharge: 2020-12-24 | Payer: MEDICARE

## 2020-12-24 ENCOUNTER — Encounter: Admit: 2020-12-24 | Discharge: 2020-12-24 | Payer: MEDICARE

## 2020-12-24 DIAGNOSIS — C4491 Basal cell carcinoma of skin, unspecified: Secondary | ICD-10-CM

## 2020-12-24 DIAGNOSIS — I2510 Atherosclerotic heart disease of native coronary artery without angina pectoris: Secondary | ICD-10-CM

## 2020-12-24 DIAGNOSIS — C73 Malignant neoplasm of thyroid gland: Secondary | ICD-10-CM

## 2020-12-24 DIAGNOSIS — H5440 Blindness, one eye, unspecified eye: Secondary | ICD-10-CM

## 2020-12-24 DIAGNOSIS — E785 Hyperlipidemia, unspecified: Secondary | ICD-10-CM

## 2020-12-24 DIAGNOSIS — I25810 Atherosclerosis of coronary artery bypass graft(s) without angina pectoris: Secondary | ICD-10-CM

## 2020-12-24 DIAGNOSIS — I495 Sick sinus syndrome: Secondary | ICD-10-CM

## 2020-12-24 DIAGNOSIS — J101 Influenza due to other identified influenza virus with other respiratory manifestations: Secondary | ICD-10-CM

## 2020-12-24 DIAGNOSIS — R0989 Other specified symptoms and signs involving the circulatory and respiratory systems: Secondary | ICD-10-CM

## 2020-12-24 DIAGNOSIS — I480 Paroxysmal atrial fibrillation: Secondary | ICD-10-CM

## 2020-12-24 DIAGNOSIS — Z950 Presence of cardiac pacemaker: Secondary | ICD-10-CM

## 2020-12-24 DIAGNOSIS — I4821 Permanent atrial fibrillation: Secondary | ICD-10-CM

## 2020-12-24 DIAGNOSIS — I4819 Other persistent atrial fibrillation: Secondary | ICD-10-CM

## 2020-12-24 DIAGNOSIS — G2581 Restless legs syndrome: Secondary | ICD-10-CM

## 2020-12-24 DIAGNOSIS — M1990 Unspecified osteoarthritis, unspecified site: Secondary | ICD-10-CM

## 2020-12-24 DIAGNOSIS — H9190 Unspecified hearing loss, unspecified ear: Secondary | ICD-10-CM

## 2020-12-24 DIAGNOSIS — I4891 Unspecified atrial fibrillation: Secondary | ICD-10-CM

## 2020-12-24 DIAGNOSIS — I10 Essential (primary) hypertension: Secondary | ICD-10-CM

## 2020-12-24 DIAGNOSIS — K219 Gastro-esophageal reflux disease without esophagitis: Secondary | ICD-10-CM

## 2020-12-24 DIAGNOSIS — Z951 Presence of aortocoronary bypass graft: Secondary | ICD-10-CM

## 2020-12-24 MED ORDER — PERFLUTREN LIPID MICROSPHERES 1.1 MG/ML IV SUSP
1-10 mL | Freq: Once | INTRAVENOUS | 0 refills | Status: CP | PRN
Start: 2020-12-24 — End: ?
  Administered 2020-12-24: 15:00:00 2 mL via INTRAVENOUS

## 2020-12-24 MED ORDER — METOPROLOL SUCCINATE 25 MG PO TB24
12.5 mg | ORAL_TABLET | Freq: Every day | ORAL | 3 refills | 90.00000 days | Status: AC
Start: 2020-12-24 — End: ?

## 2020-12-24 NOTE — Progress Notes
Date of Service: 12/24/2020    Kenneth Buchanan is a 80 y.o. male.       HPI     Kenneth Buchanan was in the office for evaluation.  It has been 15 months since I saw him.    He is 80 now.  He has remote two-vessel bypass and remote angioplasty.  He has hyperlipidemia followed by Dr. Suan Halter.  We have not done any labs on him for a few years.    He has a permanent pacemaker and permanent A. fib.    He is riding a bicycle in the neighborhood for 40-50 minutes 4-5 days/week  He denies angina  He denies PND, orthopnea, edema and dyspnea on exertion  He denies palpitation    From his standpoint he is doing well         Vitals:    12/24/20 1050   BP: 124/80   BP Source: Arm, Left Upper   Pulse: 57   SpO2: 98%   O2 Device: None (Room air)   PainSc: Zero   Weight: 87.5 kg (193 lb)   Height: 185.4 cm (6' 1)     Body mass index is 25.46 kg/m?Marland Kitchen     Past Medical History  Patient Active Problem List    Diagnosis Date Noted   ? Palpitations 11/11/2018     10/10/2018 - Holter:  A Holter monitor provides 47 hours 12 minutes of data, slightly less than 2 full days.  The rhythm is sinus/ A pace at 60-131 bpm with a mean of 64 bpm.  There are 33 isolated atrial premature beats.  There is one atrial couplet.  There is no SVT.  There is no A. fib or a flutter.  There are 18 isolated PVCs.  There are no couplets.  There is a single 5 beat run of nonsustained VT at about 150 bpm.  AV conduction: At times we have first-degree AV block and a narrow QRS and at other times we are V paced.  The pacemaker seems to be functioning normally.  There are no pauses.  There are 7 diary entries.  Diary 1 is for breathing hard sitting.  This looks like sinus at about 85 bpm but there is a lot of artifact.  Diary 2 is for breathless walking in the house.  This is a pace and 86 with a narrow QRS then later in the strip the QRS is paced for 4 beats and goes back to narrow.  Diary 3 is for dizzy this is a paced with a narrow QRS diary for is for chest tightness this is a paced with a narrow QRS.  None of the diary entries show anything remarkable and none of them show significant ventricular pacing.     ? Obstructive sleep apnea 04/27/2018     HST done 02/21/2018 showed AHI of 19/hr mostly in supine position.  2/20 CPAP- feels a bit better     ? 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 Pacemaker 02/08/2018     11/28/2017 - Dual Chamber PPM implant with SHS     ? Sinus node dysfunction (HCC) 11/28/2017   ? H/O thyroidectomy 11/23/2017   ? On continuous oral anticoagulation 10/07/2015   ? Hematoma of [place] post TEE 10/05/2015     09/2015-on Triple anticoagulation     ? Prior use of AMIO (DC 11/2018) 09/29/2015     11/14: Initial Doristine Devoid) AF, amio 200/d-Dr  Hannen  11/15 Decrease to 100/d  12/16 Pers AF-DC amio  7/17 Amio 200/d  3/18 Amio reduced to 1200/week  Somehow got on 2100/week then in 2020 reduced to 1400 then 700/week  8/20: dr Andreas Newport DCs amio for weakness/fatigue etc     ? 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 Permanent atrial fibrillation (HCC) 09/03/2015   ? CAD S/P percutaneous coronary angioplasty 09/03/2015     07/05/2018 - Regadenoson MPI:  This study is borderline abnormal.  There is some partial evidence for slightly reversible uptake in the obtuse marginal circumflex distribution laterally.  Global left ventricular function at rest is mildly reduced but improves post stress.  The patient has a single 3 beat run of ventricular tachycardia during exercise without chest pain.  All segments are definitely viable.  Other high risk indicators are not noted.  10/08/2018 - Cardiac Catheterization:  Severe native coronary artery disease as described above.  Patent LIMA to the LAD and vein graft to the RCA.  Normal LVEDP.  10/10/2018 - ECHO:  Left ventricular systolic function is within normal limits.  LVEF 55-60%.  Mild concentric left ventricular hypertrophy.   Mild diastolic dysfunction.  Mild left atrial enlargement.  No significant valvular abnormalities.  Mild ascending aortic root dilation, measuring 4.1 cm.  Trace pericardial effusion.  10/13/2020 - ECHO:  Normal left ventricular cavitary dimensions with borderline LV systolic function with a visually estimated ejection fraction of ~50%.  Upper limit of normal right ventricular cavitary size and normal RV systolic function.  Unable to determine LV diastolic function secondary to underlying atrial fibrillation  Abnormal septal motion secondary to prior cardiac surgery.  No focal regional wall motion abnormality.  Mild right atrial enlargement  Mild dilation of the ascending aorta measuring 3.9 cm.  Calcified aortic valve leaflets: Aortic valve sclerosis without significant stenosis.  Mild aortic insufficiency.  No other hemodynamically significant valvular stenosis  At least moderate degenerative mitral insufficiency, eccentric and posteriorly directed.  Eccentricity may be underestimating the extent of mitral insufficiency.  Pacemaker lead identified in the right atrium  Estimated PA systolic pressure is 30 mmHg  No pericardial effusion.?  10/13/2020 Smitty Cords Treadmill MPI:  This study is probably normal with no evidence of significant myocardial ischemia. Left ventricular systolic function is normal. There are no high risk prognostic indicators present.  The exercise ECG is notable for atrial fibrillation.  The study was negative for ischemia.  Demonstration of intermittent ventricular pacing in recovery.  The patient demonstrated poor exercise capacity with a normal heart rate and blood pressure response to exercise.   Compared to a prior March 2020 study.  The prior 2020 study was deemed to be borderline abnormal.  Partial evidence for a slightly reversible uptake in the obtuse marginal circumflex distribution laterally.  LV systolic function at rest was mildly reduced but improved post stress.  There was demonstration of a 3 beat run of ventricular tachycardia with exercise though no symptoms of chest pain.  Perfusion patterns however thought to be similar to a prior 2018 study.  In comparison, there are no readily identified perfusion abnormalities on the current study.  There is demonstration of a mildly reduced ejection fraction of 44%.  Global LV hypokinesis.  Of note, the current study was done while the patient was in atrial fibrillation.  Prior studies were done in the context of a sinus rhythm.  Review of his prior June 15 device evaluation reported a presenting rhythm of a V paced/V sensed rhythm.  In  aggregate the current study is intermediate risk in regards to predicted annual cardiovascular mortality rate.  Of note, the burden of risk is assigned to the patient's mildly reduced left ventricular systolic function and not burden of ischemia.       ? 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 Holter monitor, abnormal 08/31/2015   ? Chronic fatigue 07/27/2015   ? At risk for stroke 03/24/2015   ? 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 Neck mass 02/20/2014   ? Essential hypertension 07/14/2011   ? 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 Detached retina, right 01/14/2010     July 2011     ? Coronary artery disease involving coronary bypass graft of native heart without angina pectoris 01/05/2009     Coronary artery disease, status post percutaneous coronary intervention of saphenous      vein graft to right coronary artery on 10/26/2007 at [facility].  Personal history of premature coronary artery disease -- The patient underwent his      first percutaneous coronary intervention at age 70  08/03/15 Echo: EF 60%. Mild MR. PAP 20 mmHg.   08/05/15 Bruce thallium: This study is abnormal due to calculated lower left ventricular ejection fraction of 42%. This is likely due to the patient's rapid ventricular rates noted with exercise stress. His peak heart rate achieved was 204 beats per minute which was 140% predicted heart rate for age. This is due to rapid ventricular response with atrial fibrillation. There is no definite significant inducible ischemia present. The patient did have dyspnea with physical activity and he was able to exercise for a maximal duration of 7 minutes 24 seconds. ?  09/03/15 LHC: Hemodynamically significant in-stent restenotic lesion in previously placed BMS to venous graft to RCA. Strongly positive fractional flow reserve assessment with reduction in FFR value from 0.94 to 0.72 within 45 seconds. Successful PCI of in-stent restenotic lesion in SVG to RCA with [id number] Herculink Elite stent with excellent angiographic results.      ? Dyslipidemia 01/05/2009     Hyperlipidemia, on Crestor -- LDL is almost at goal.     ? Restless leg syndrome 01/05/2009   ? Family history of early CAD 01/05/2009     Family history of premature coronary artery disease -- The patient's brother died of      sudden cardiac death and another brother had coronary artery bypass graft at age 13.           Review of Systems   Constitutional: Positive for malaise/fatigue.   HENT: Negative.    Eyes: Negative.    Cardiovascular: Negative.    Respiratory: Negative.    Endocrine: Negative.    Hematologic/Lymphatic: Negative.    Skin: Negative.    Musculoskeletal: Negative.    Gastrointestinal: Negative.    Genitourinary: Negative.    Neurological: Negative.    Psychiatric/Behavioral: Negative.    Allergic/Immunologic: Negative.        Physical Exam  General Appearance: no acute distress  Skin: warm, moist, no ulcers  HEENT: PERL  Neck Veins: neck veins are flat, neck veins are not distended  Carotid Arteries: normal carotid upstroke bilaterally, no bruits  Chest Inspection: chest is normal in appearance  Auscultation/Percussion: lungs clear to auscultation, no rales, rhonchi, or wheezing  Cardiac Auscultation: Normal S1 & S2, no S3 or S4, no  rub  Murmurs: no cardiac murmur  Extremities: no lower extremity edema; 2+ symmetric distal pulses  Abdominal Exam: soft, non-tender, no masses, bowel sounds normal  Liver & Spleen: no organomegaly  Neurologic Exam: normal station and gait      Cardiovascular Studies  Most recent results for 12-Lead ECG   ECG 12-LEAD    Collection Time: 12/24/20 10:47 AM   Result Value Status    VENTRICULAR RATE 57 Final    P-R INTERVAL  Final    QRS DURATION 104 Final    Q-T INTERVAL 462 Final    QTC CALCULATION (BAZETT) 449 Final    P AXIS  Final    R AXIS 65 Final    T AXIS 56 Final    Impression    Atrial fibrillation with slow ventricular response with frequent ventricular-paced complexes  Abnormal ECG  No previous ECGs available  Confirmed by [name], Porter Moes ([id number]) on 12/24/2020 11:37:50 AM     His pacemaker has more than 14 years on the battery.  He is RV pacing 27% of the time which is more than what we have seen in the past.    Cardiovascular Health Factors  Vitals BP Readings from Last 3 Encounters:   12/24/20 124/80   12/24/20 127/78   11/23/20 134/74     Wt Readings from Last 3 Encounters:   12/24/20 87.5 kg (193 lb)   12/24/20 87.5 kg (193 lb)   11/23/20 87.6 kg (193 lb 3.2 oz)     BMI Readings from Last 3 Encounters:   12/24/20 25.46 kg/m?   12/24/20 25.46 kg/m?   11/23/20 25.49 kg/m?      Smoking Social History     Tobacco Use   Smoking Status Former Smoker   ? Packs/day: 3.50   ? Years: 20.00   ? Pack years: 70.00   ? Types: Cigarettes   ? Quit date: 04/16/1982   ? Years since quitting: 38.7   Smokeless Tobacco Never Used      Lipid Profile Cholesterol   Date Value Ref Range Status   07/31/2017 129 (L) 150 - 200 Final     HDL   Date Value Ref Range Status   07/31/2017 37  Final     LDL   Date Value Ref Range Status   07/31/2017 81  Final     Triglycerides   Date Value Ref Range Status   07/31/2017 76  Final      Blood Sugar No results found for: HGBA1C  Glucose   Date Value Ref Range Status   11/25/2020 96 70 - 100 MG/DL Final   16/01/9603 95  Final   09/20/2018 94  Final     Glucose, POC   Date Value Ref Range Status   02/06/2017 98 70 - 100 MG/DL Final          Problems Addressed Today  Encounter Diagnoses   Name Primary?   ? Permanent atrial fibrillation (HCC) Yes   ? Cardiovascular symptoms    ? CAD S/P percutaneous coronary angioplasty    ? S/P CABG x 2    ? Essential hypertension    ? Coronary artery disease involving coronary bypass graft of native heart without angina pectoris    ? Dyslipidemia    ? Pacemaker        Assessment and Plan     He has been on Toprol-XL 25 mg daily.  I am reducing that to 12.5 mg daily to try and reduce RV  pacing.  He is set to a low rate of 50.    He had a thallium stress test earlier in the summer.  His EF was a little lower than in the past which I thought was likely a gating phenomenon related to technical issues for assessing EF with an irregularly irregular rate.  We did an echo today and I thought his EF was 50% which is stable.  The formal report is pending.    His LDL goal remains 70 or less    I appreciate the opportunity to assist in his care and anticipate seeing him back in about 1 year.         Current Medications (including today's revisions)  ? acetaminophen (TYLENOL) 500 mg tablet Take 1,000 mg by mouth as Needed for Pain.   ? aflibercept (EYLEA) 2 mg/0.05 mL intravitreal injection 2 mg by INTRAVITREAL route. Every 8 weeks   ? aspirin EC 81 mg tablet Take one tablet by mouth daily. Hold for 7 days, resume 8/27   ? atorvastatin (LIPITOR) 80 mg tablet TAKE 1 TABLET BY MOUTH EVERY DAY (Patient taking differently: Take 80 mg by mouth at bedtime daily.)   ? levothyroxine (SYNTHROID) 150 mcg tablet Take one tablet by mouth daily 30 minutes before breakfast.   ? metoprolol succinate XL (TOPROL XL) 25 mg extended release tablet Take one-half tablet by mouth daily.   ? nitroglycerin (NITROSTAT) 0.4 mg tablet Place one tablet under tongue every 5 minutes as needed for Chest Pain. Max of 3 tablets, call 911.   ? timolol XE(+) (TIMOPTIC-XR) 0.5 % ophthalmic gel Place 1 drop into or around eye(s) twice daily.   ? XARELTO 20 mg tablet TAKE ONE TABLET BY MOUTH EVERY DAY WITH FOOD (Patient taking differently: Take 20 mg by mouth daily with dinner.)

## 2021-01-04 ENCOUNTER — Encounter: Admit: 2021-01-04 | Discharge: 2021-01-04 | Payer: MEDICARE

## 2021-01-17 ENCOUNTER — Encounter: Admit: 2021-01-17 | Discharge: 2021-01-17 | Payer: MEDICARE

## 2021-01-17 DIAGNOSIS — E7849 Other hyperlipidemia: Secondary | ICD-10-CM

## 2021-01-17 MED ORDER — ATORVASTATIN 80 MG PO TAB
ORAL_TABLET | Freq: Every day | 0 refills | Status: AC
Start: 2021-01-17 — End: ?

## 2021-02-27 ENCOUNTER — Encounter: Admit: 2021-02-27 | Discharge: 2021-02-27 | Payer: MEDICARE

## 2021-03-30 NOTE — Progress Notes
Date of Service: 04/02/2021     Subjective:             Kenneth Buchanan is a 80 y.o. male with a history of hypertension, hyperlipidemia, coronary artery disease with previous bypass, sinus node dysfunction, atrial fibrillation on chronic anticoagulation    History of Present Illness     Kenneth Buchanan  who was seen today as a follow up visit for papillary thyroid cancer. He was last seen on 11/13/19.   Since the last visit, he denies any changes in his health. He has been fatigue but stable. He does not sleep well. He is having difficulty going to sleep.  He only takes caffeine in the morning but takes soda sometimes in the afternoon. He has not been using CPAP since Feb 2021 due to recall.   He exercises by riding a bicycle 40 to 50 minutes, 4 to 5 days/week and walks when cold outside.  He did received a steroid injection in  both shoulders in May 2020.  He denied other steroid exposure. He denied any loose stool, shakiness, or palpitation. He has a history of CAD and paroxysmal atrial fibrillation. He was on amiodarone in the past. His weight has been fairly stable.       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  None of the 10 lymph nodes from central neck dissection were cancerous.  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   - His postoperative thyroglobulin was 0.8 and antibody less than 3 on November 25, 2014.  Unfortunately, there was no TSH at that time.   - He has been taking levothyroxine  150 mcg daily  with most recent TSH was 0.67 on 09/18/2020.  He takes this consistently on empty stomach in the morning.-  Most recent thyroglobulin was 0.2 on 09/18/20 with TSH of 0.67 at that time.   - Most recent head and neck ultrasound 11/23/2020 without evidence of residual or recurrent disease.      Family History:  Denied family history of thyroid cancer.  Mother had coronary artery disease and passed away at age 44.  Denied any head or thyroid cancer in the family.    SH:  He quit smoking in 1983 for three packs per day for 23 years.  He is married.  He consumes eight beers per week.     Medical History:   Diagnosis Date   ? Arthritis    ? Atrial fibrillation [facility])     s/p cardioversion 2014   ? Basal cell carcinoma    ? CAD (coronary artery disease) 1992   ? CAD (coronary artery disease), native coronary artery 2009    Coronary artery disease, status post percutaneous coronary intervention of saphenous     vein graft to right coronary artery on 10/26/2007 at [facility]. Personal history of premature coronary artery disease -- The patient underwent his     first percutaneous coronary intervention at age 40.    ? Essential hypertension 07/14/2011   ? GERD (gastroesophageal reflux disease)     GERD   ? Hearing loss    ? Hyperlipidemia    ?  Influenza A    ? 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     ? Pacemaker 02/08/2018   ? PAF (paroxysmal atrial fibrillation) (HCC) 02/07/2013   ? Persistent atrial fibrillation (HCC) 08/2015   ? Restless leg syndrome    ? Thyroid cancer (HCC)             Review of Systems  Constitutional:   Fatigue: yes  Pain: 0/10  Skin:   Heat or cold intolerance : yes  Dry skin : no  Flushing episodes : no  Hair loss : no  Excessive body or facial hair: no  Head   Headaches: no  Double or blurry vision : no  Difficulty swallowing or choking : no  Feeling of food stuck: no  Neck enlargement or lumps : no  Head or chest radiation exposure : no  Decreased hearing : no  Chest/Heart   Nipple discharge : no  Chest pain or pressure: no  Heartburn: no  Shortness of breath: yes  Heart racing or pounding :no  Digestion Abdominal or belly pain : no  Constipation : no  Diarrhea : no  Nausea and vomiting: no  Reflux : no  Lactose intollerance : no  Urinary   Kidney stones: no   Night time urination : no  Skeleton   Muscle cramping: yes  Glands   Weight loss : no  Weight gain : no  Nerves   Nervous or anxious : no  Seizures or falls : no  Dizziness : no  numbenss or tingling : no  Mental Health   Do you feel sad? : no  Sleep problems : yes     Objective:         ? acetaminophen (TYLENOL) 500 mg tablet Take 1,000 mg by mouth as Needed for Pain.   ? aflibercept (EYLEA) 2 mg/0.05 mL intravitreal injection 2 mg by INTRAVITREAL route. Every 8 weeks   ? aspirin EC 81 mg tablet Take one tablet by mouth daily. Hold for 7 days, resume 8/27   ? atorvastatin (LIPITOR) 80 mg tablet TAKE 1 TABLET BY MOUTH EVERY DAY   ? levothyroxine (SYNTHROID) 150 mcg tablet Take one tablet by mouth daily 30 minutes before breakfast.   ? metoprolol succinate XL (TOPROL XL) 25 mg extended release tablet Take one-half tablet by mouth daily.   ? nitroglycerin (NITROSTAT) 0.4 mg tablet Place one tablet under tongue every 5 minutes as needed for Chest Pain. Max of 3 tablets, call 911.   ? timolol XE(+) (TIMOPTIC-XR) 0.5 % ophthalmic gel Place 1 drop into or around eye(s) twice daily.   ? XARELTO 20 mg tablet TAKE ONE TABLET BY MOUTH EVERY DAY WITH FOOD (Patient taking differently: Take 20 mg by mouth daily with dinner.)     Vitals:    04/02/21 1302   BP: (!) 140/86   BP Source: Arm, Right Upper   Pulse: 58   Temp: 36.4 ?C (97.5 ?F)   Resp: 20   TempSrc: Oral   PainSc: Zero   Weight: 89 kg (196 lb 3.2 oz)   Height: 185.4 cm (6' 1)     Body mass index is 25.89 kg/m?Marland Kitchen     Physical Exam  General Appearance: alert, no distress   Skin: warm, no ulcers or xanthomas;  Digits and Nails: no cyanosis or clubbing    Thyroid: no nodules, masses, tenderness or enlargement   Respiratory Effort: breathing comfortably, no respiratory  distress   Cardiac Rhythm: regular rhythm and normal rate   Peripheral Circulation: normal peripheral circulation   Lower Extremity Edema: no lower extremity edema   Abdominal Exam: mildly protuberant but soft, non-tender, no masses, no purple striae  Gait & Station: walks without assistance   Muscle Strength: normal muscle tone   Orientation: oriented to time, place and person   Affect & Mood: appropriate and sustained affect   Language and Memory: patient responsive and seems to comprehend information   Neurologic Exam: neurological assessment grossly intact   Other: moves all extremities, No tremor          Radiology  ULTRASOUND OF THE NECK     CLINICAL INDICATION: Male, 79 years; ?history of thyroid cancer. History   of thyroidectomy.. Papillary thyroid carcinoma status post thyroidectomy   in April, 2016     TECHNIQUE: Multiple grayscale and color Doppler ultrasound images were   obtained of the thyroid gland and anterior neck.     COMPARISON: Thyroid ultrasound February 20, 2019.     FINDINGS:     Central compartment:   Right thyroid bed: No nodules identified.     Left thyroid bed: No nodules identified.     Lymph nodes: No enlarged or morphologically suspicious central compartment   lymph nodes.     Lateral compartments:   Right: Normal size lymph nodes are seen. ?No lymph nodes demonstrate round   shape, calcification or cystic change.     Left: Normal size lymph nodes are seen. ?No lymph nodes demonstrate round   shape, calcification or cystic change.     IMPRESSION       Status post thyroidectomy, without evidence of residual/recurrent disease.       ?Finalized by Lafe Garin, MD on 11/23/2020 5:25 PM. Dictated by Lafe Garin, MD on 11/23/2020 5:23 PM.    Latest Reference Range & Units 09/18/20 12:54 11/25/20 09:07   Sodium 137 - 147 MMOL/L  143   Potassium 3.5 - 5.1 MMOL/L  3.8   Chloride 98 - 110 MMOL/L  107   CO2 21 - 30 MMOL/L  25   Anion Gap 3 - 12   11   Blood Urea Nitrogen 7 - 25 MG/DL  14   Creatinine 0.4 - 1.24 MG/DL  1.61   eGFR >09 mL/min  >60 Glucose 70 - 100 MG/DL  96   Calcium 8.5 - 60.4 MG/DL  8.5   TSH 5.40 - 9.81 MCU/ML 0.67    Thyroglobulin Ab <116 IU/mL 13    Thyroglobulin 1.7 - 55.0 NG/ML 0.2 (L)    (L): Data is abnormally low    Assessment and Plan:      Papillary thyroid carcinoma: A 1.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  He also has microcarcinoma over left lobe of thyroid with no cervical lymph node involvement.  His postop ultrasound from August of 2016 showed no evidence of recurrence as well as his thyroglobulin was appropriately suppressed to 0.8.  Radioactive iodine was not indicated given low risk tumor characteristic and low thyroglobulin level and imaging study postop.  His postoperative thyroglobulin was 0.8 and antibody less than 3 on November 25, 2014.  Unfortunately, there was no TSH at that time.  His postoperative thyroglobulin was 0.8 and antibody less than 3 on November 25, 2014.  Unfortunately, there was no TSH at that time. Most recent thyroglobulin was 0.2 on 09/18/20 with TSH of 0.67 at that time.  Most recent head and neck ultrasound 11/23/2020 without evidence of residual or recurrent disease.    He is currently at low risk of thyroid cancer recurrence. Target TSH should be in around 0.5-2  but due to history of atrial fibrillation, TSH target could be higher as long as not higher than reference range.     Plan:   Continue levothyroxine 150 mcg daily  with most recent TSH was 0.67 on 09/18/20.  Check TSH and thyroglobulin panel yearly   Check neck ultrasound every 3 years, sooner if abnormal labs.    Fatigue this is likely multifactorial given advanced heart disease in addition to mild anemia and other health problems.  Referral sent to sleep clinic as he has not been using CPAP in the past year after the recall.    Return to clinic in 12 months.

## 2021-04-02 ENCOUNTER — Encounter: Admit: 2021-04-02 | Discharge: 2021-04-02 | Payer: MEDICARE

## 2021-04-02 ENCOUNTER — Ambulatory Visit: Admit: 2021-04-02 | Discharge: 2021-04-02 | Payer: MEDICARE

## 2021-04-02 DIAGNOSIS — C4491 Basal cell carcinoma of skin, unspecified: Secondary | ICD-10-CM

## 2021-04-02 DIAGNOSIS — I480 Paroxysmal atrial fibrillation: Secondary | ICD-10-CM

## 2021-04-02 DIAGNOSIS — H9190 Unspecified hearing loss, unspecified ear: Secondary | ICD-10-CM

## 2021-04-02 DIAGNOSIS — I4819 Other persistent atrial fibrillation: Secondary | ICD-10-CM

## 2021-04-02 DIAGNOSIS — I4891 Unspecified atrial fibrillation: Secondary | ICD-10-CM

## 2021-04-02 DIAGNOSIS — Z950 Presence of cardiac pacemaker: Secondary | ICD-10-CM

## 2021-04-02 DIAGNOSIS — C73 Malignant neoplasm of thyroid gland: Principal | ICD-10-CM

## 2021-04-02 DIAGNOSIS — M1990 Unspecified osteoarthritis, unspecified site: Secondary | ICD-10-CM

## 2021-04-02 DIAGNOSIS — I2510 Atherosclerotic heart disease of native coronary artery without angina pectoris: Secondary | ICD-10-CM

## 2021-04-02 DIAGNOSIS — K219 Gastro-esophageal reflux disease without esophagitis: Secondary | ICD-10-CM

## 2021-04-02 DIAGNOSIS — E785 Hyperlipidemia, unspecified: Secondary | ICD-10-CM

## 2021-04-02 DIAGNOSIS — G2581 Restless legs syndrome: Secondary | ICD-10-CM

## 2021-04-02 DIAGNOSIS — H5440 Blindness, one eye, unspecified eye: Secondary | ICD-10-CM

## 2021-04-02 DIAGNOSIS — J101 Influenza due to other identified influenza virus with other respiratory manifestations: Secondary | ICD-10-CM

## 2021-04-02 DIAGNOSIS — E890 Postprocedural hypothyroidism: Secondary | ICD-10-CM

## 2021-04-02 DIAGNOSIS — I10 Essential (primary) hypertension: Secondary | ICD-10-CM

## 2021-04-02 DIAGNOSIS — G4733 Obstructive sleep apnea (adult) (pediatric): Secondary | ICD-10-CM

## 2021-04-02 NOTE — Patient Instructions
I sent request to sleep clinic to call you. Labs in the summer when you are hear at [place] and see you in one year.

## 2021-04-06 ENCOUNTER — Encounter: Admit: 2021-04-06 | Discharge: 2021-04-06 | Payer: MEDICARE

## 2021-04-27 ENCOUNTER — Encounter: Admit: 2021-04-27 | Discharge: 2021-04-27 | Payer: MEDICARE

## 2021-04-27 DIAGNOSIS — E7849 Other hyperlipidemia: Secondary | ICD-10-CM

## 2021-04-27 DIAGNOSIS — E890 Postprocedural hypothyroidism: Secondary | ICD-10-CM

## 2021-04-27 MED ORDER — LEVOTHYROXINE 150 MCG PO TAB
ORAL_TABLET | Freq: Every day | ORAL | 1 refills | 30.00000 days | Status: AC
Start: 2021-04-27 — End: ?

## 2021-04-27 MED ORDER — ATORVASTATIN 80 MG PO TAB
80 mg | ORAL_TABLET | Freq: Every day | ORAL | 0 refills | Status: AC
Start: 2021-04-27 — End: ?

## 2021-05-31 ENCOUNTER — Encounter: Admit: 2021-05-31 | Discharge: 2021-05-31 | Payer: MEDICARE

## 2021-05-31 NOTE — Telephone Encounter
Pt LVM stating he is out of town  Received: Today  Hietbrink, Brittney, RN  de Jesus, Myriam Jacobson  He LVM stating he is out of town and forgot to take his monitor with him. He has been gone for about 2 weeks and should probably be home sometime this week. He will send a transmission when he is home.     Thanks,   Brittney/device team

## 2021-05-31 NOTE — Telephone Encounter
Received notification that  [employer] has not been connected since 05/17/21. Patient was instructed to look at his/her transmitter to make sure that it is plugged into power and send a manual transmission to reconnect the transmitter. If he/she has any questions about how to send a transmission or if the transmitter does not appear to be working properly, they need to contact the device company directly. Patient was provided with that contact number. Requested the patient send [place] a MyChart message or contact our device nurses at [phone] to let us know after they have sent their transmission. Placed call to preferred phone number, got no answer LVM.   MyChart sent.   CDJ     Note: Patient needs to send a manual remote interrogation to reestablish communication to his/her remote transmitter.

## 2021-06-09 ENCOUNTER — Encounter: Admit: 2021-06-09 | Discharge: 2021-06-09 | Payer: MEDICARE

## 2021-07-26 ENCOUNTER — Encounter: Admit: 2021-07-26 | Discharge: 2021-07-26 | Payer: MEDICARE

## 2021-07-26 DIAGNOSIS — E7849 Other hyperlipidemia: Secondary | ICD-10-CM

## 2021-07-26 MED ORDER — ATORVASTATIN 80 MG PO TAB
ORAL_TABLET | 0 refills | Status: AC
Start: 2021-07-26 — End: ?

## 2021-08-16 ENCOUNTER — Encounter: Admit: 2021-08-16 | Discharge: 2021-08-16 | Payer: MEDICARE

## 2021-09-07 ENCOUNTER — Encounter: Admit: 2021-09-07 | Discharge: 2021-09-07 | Payer: MEDICARE

## 2021-09-07 DIAGNOSIS — E7849 Other hyperlipidemia: Secondary | ICD-10-CM

## 2021-09-07 MED ORDER — ATORVASTATIN 80 MG PO TAB
ORAL_TABLET | 0 refills | Status: AC
Start: 2021-09-07 — End: ?

## 2021-09-10 ENCOUNTER — Encounter: Admit: 2021-09-10 | Discharge: 2021-09-10 | Payer: MEDICARE

## 2021-10-07 ENCOUNTER — Encounter: Admit: 2021-10-07 | Discharge: 2021-10-07 | Payer: MEDICARE

## 2021-10-07 DIAGNOSIS — E7849 Other hyperlipidemia: Secondary | ICD-10-CM

## 2021-10-07 MED ORDER — ATORVASTATIN 80 MG PO TAB
ORAL_TABLET | 0 refills | Status: AC
Start: 2021-10-07 — End: ?

## 2021-10-08 ENCOUNTER — Encounter: Admit: 2021-10-08 | Discharge: 2021-10-08 | Payer: MEDICARE

## 2021-10-08 ENCOUNTER — Ambulatory Visit: Admit: 2021-10-08 | Discharge: 2021-10-08 | Payer: MEDICARE

## 2021-10-08 DIAGNOSIS — E7849 Other hyperlipidemia: Secondary | ICD-10-CM

## 2021-10-08 DIAGNOSIS — C73 Malignant neoplasm of thyroid gland: Secondary | ICD-10-CM

## 2021-10-08 LAB — BASIC METABOLIC PANEL
ANION GAP: 9 mg/dL — ABNORMAL LOW (ref >40)
BLD UREA NITROGEN: 18 mg/dL (ref 7–25)
CALCIUM: 8.7 mg/dL (ref 8.5–10.6)
CREATININE: 0.8 mg/dL (ref 0.4–1.24)
EGFR: >60 mL/min (ref >60)
POTASSIUM: 4 MMOL/L (ref 3.5–5.1)
SODIUM: 141 MMOL/L (ref 137–147)

## 2021-10-08 LAB — LIPID PROFILE
CHOLESTEROL: 112 mg/dL (ref <200)
LDL: 67 mg/dL (ref <100)
TRIGLYCERIDES: 74 mg/dL (ref <150)

## 2021-10-08 LAB — THYROGLOBULIN & THYROGLOBULIN AB
ANTI THYROGLOB ANTI: 13 [IU]/mL (ref <116)
THYROGLOBULIN: 0.1 ng/mL — ABNORMAL LOW (ref 1.7–55.0)

## 2021-10-08 LAB — THYROID STIMULATING HORMONE-TSH: TSH: 1.2 uU/mL (ref 0.35–5.00)

## 2021-10-17 ENCOUNTER — Encounter: Admit: 2021-10-17 | Discharge: 2021-10-17 | Payer: MEDICARE

## 2021-10-17 DIAGNOSIS — E7849 Other hyperlipidemia: Secondary | ICD-10-CM

## 2021-10-17 MED ORDER — ATORVASTATIN 80 MG PO TAB
ORAL_TABLET | 11 refills | Status: AC
Start: 2021-10-17 — End: ?

## 2021-10-25 ENCOUNTER — Encounter: Admit: 2021-10-25 | Discharge: 2021-10-25 | Payer: MEDICARE

## 2021-10-25 DIAGNOSIS — E890 Postprocedural hypothyroidism: Secondary | ICD-10-CM

## 2021-10-25 MED ORDER — LEVOTHYROXINE 150 MCG PO TAB
ORAL_TABLET | 1 refills
Start: 2021-10-25 — End: ?

## 2021-10-25 MED ORDER — XARELTO 20 MG PO TAB
ORAL_TABLET | ORAL | 3 refills | 30.00000 days | Status: AC
Start: 2021-10-25 — End: ?

## 2021-10-27 ENCOUNTER — Encounter: Admit: 2021-10-27 | Discharge: 2021-10-27 | Payer: MEDICARE

## 2021-10-27 DIAGNOSIS — E890 Postprocedural hypothyroidism: Secondary | ICD-10-CM

## 2021-10-27 MED ORDER — LEVOTHYROXINE 150 MCG PO TAB
150 ug | ORAL_TABLET | Freq: Every day | ORAL | 1 refills | 30.00000 days | Status: AC
Start: 2021-10-27 — End: ?

## 2021-10-27 MED ORDER — LEVOTHYROXINE 150 MCG PO TAB
ORAL_TABLET | 1 refills
Start: 2021-10-27 — End: ?

## 2021-12-11 ENCOUNTER — Encounter: Admit: 2021-12-11 | Discharge: 2021-12-11 | Payer: MEDICARE

## 2021-12-30 ENCOUNTER — Encounter: Admit: 2021-12-30 | Discharge: 2021-12-30 | Payer: MEDICARE

## 2022-01-05 ENCOUNTER — Encounter: Admit: 2022-01-05 | Discharge: 2022-01-05 | Payer: MEDICARE

## 2022-01-05 NOTE — Telephone Encounter
-----   Message from Louis Meckel, LPN sent at 1/66/0630  9:52 AM CDT -----  Regarding: LDB- med ?  VM on triage line from [facility] with Dr. Jaclyn Shaggy office at 9:19am.  Michela Pitcher that they have a medication question.  Call her at [phone].

## 2022-01-05 NOTE — Telephone Encounter
returned call to Dr Tobey Grim nurse, Kenneth Buchanan.  Dr Jaclyn Shaggy reviewed lab results for Kenneth Buchanan and due to low testosterone level has recommended ordering testosterone.  Dr Jaclyn Shaggy  would like Dr Soyla Murphy opinion from a cardiac perspective if this would be advised.      Kenneth Buchanan last saw Dr Artist Beach on 12/24/20 and is due for a follow up appt in October.    Sending message to Dr Artist Beach for his review and recommendations

## 2022-01-18 ENCOUNTER — Encounter: Admit: 2022-01-18 | Discharge: 2022-01-18 | Payer: MEDICARE

## 2022-01-18 DIAGNOSIS — Z8249 Family history of ischemic heart disease and other diseases of the circulatory system: Secondary | ICD-10-CM

## 2022-01-18 DIAGNOSIS — I25810 Atherosclerosis of coronary artery bypass graft(s) without angina pectoris: Secondary | ICD-10-CM

## 2022-01-18 DIAGNOSIS — I10 Essential (primary) hypertension: Secondary | ICD-10-CM

## 2022-01-18 DIAGNOSIS — I4729 Nonsustained ventricular tachycardia (HCC): Secondary | ICD-10-CM

## 2022-01-18 DIAGNOSIS — I495 Sick sinus syndrome: Secondary | ICD-10-CM

## 2022-01-18 DIAGNOSIS — E785 Hyperlipidemia, unspecified: Secondary | ICD-10-CM

## 2022-01-18 DIAGNOSIS — I4821 Permanent atrial fibrillation: Secondary | ICD-10-CM

## 2022-01-18 DIAGNOSIS — I2510 Atherosclerotic heart disease of native coronary artery without angina pectoris: Secondary | ICD-10-CM

## 2022-01-18 DIAGNOSIS — Z951 Presence of aortocoronary bypass graft: Secondary | ICD-10-CM

## 2022-01-18 NOTE — Telephone Encounter
Called patient to see if he knew why his OV with LDB tomorrow was cancelled. Looks like it was cancelled by ENT. Patient had no idea but was agreeable to cancel device check as well and reschedule both for next available with LDB. Message sent to scheduling.

## 2022-01-19 ENCOUNTER — Encounter: Admit: 2022-01-19 | Discharge: 2022-01-19 | Payer: MEDICARE

## 2022-02-15 ENCOUNTER — Encounter: Admit: 2022-02-15 | Discharge: 2022-02-15 | Payer: MEDICARE

## 2022-02-15 MED ORDER — METOPROLOL SUCCINATE 25 MG PO TB24
12.5 mg | ORAL_TABLET | Freq: Every day | ORAL | 3 refills | 90.00000 days | Status: AC
Start: 2022-02-15 — End: ?

## 2022-02-17 ENCOUNTER — Encounter: Admit: 2022-02-17 | Discharge: 2022-02-17 | Payer: MEDICARE

## 2022-02-17 DIAGNOSIS — I495 Sick sinus syndrome: Secondary | ICD-10-CM

## 2022-02-17 DIAGNOSIS — I10 Essential (primary) hypertension: Secondary | ICD-10-CM

## 2022-03-08 ENCOUNTER — Encounter: Admit: 2022-03-08 | Discharge: 2022-03-08 | Payer: MEDICARE

## 2022-03-14 ENCOUNTER — Encounter: Admit: 2022-03-14 | Discharge: 2022-03-14 | Payer: MEDICARE

## 2022-03-29 ENCOUNTER — Encounter: Admit: 2022-03-29 | Discharge: 2022-03-29 | Payer: MEDICARE

## 2022-03-29 ENCOUNTER — Ambulatory Visit: Admit: 2022-03-29 | Discharge: 2022-03-29 | Payer: MEDICARE

## 2022-03-29 DIAGNOSIS — I4821 Permanent atrial fibrillation: Secondary | ICD-10-CM

## 2022-03-29 DIAGNOSIS — I2510 Atherosclerotic heart disease of native coronary artery without angina pectoris: Secondary | ICD-10-CM

## 2022-03-29 DIAGNOSIS — K219 Gastro-esophageal reflux disease without esophagitis: Secondary | ICD-10-CM

## 2022-03-29 DIAGNOSIS — I495 Sick sinus syndrome: Secondary | ICD-10-CM

## 2022-03-29 DIAGNOSIS — Z950 Presence of cardiac pacemaker: Secondary | ICD-10-CM

## 2022-03-29 DIAGNOSIS — I4891 Unspecified atrial fibrillation: Secondary | ICD-10-CM

## 2022-03-29 DIAGNOSIS — I4729 Nonsustained ventricular tachycardia (HCC): Secondary | ICD-10-CM

## 2022-03-29 DIAGNOSIS — M1990 Unspecified osteoarthritis, unspecified site: Secondary | ICD-10-CM

## 2022-03-29 DIAGNOSIS — C4491 Basal cell carcinoma of skin, unspecified: Secondary | ICD-10-CM

## 2022-03-29 DIAGNOSIS — H5440 Blindness, one eye, unspecified eye: Secondary | ICD-10-CM

## 2022-03-29 DIAGNOSIS — I10 Essential (primary) hypertension: Secondary | ICD-10-CM

## 2022-03-29 DIAGNOSIS — R0989 Other specified symptoms and signs involving the circulatory and respiratory systems: Secondary | ICD-10-CM

## 2022-03-29 DIAGNOSIS — I4819 Other persistent atrial fibrillation: Secondary | ICD-10-CM

## 2022-03-29 DIAGNOSIS — I480 Paroxysmal atrial fibrillation: Secondary | ICD-10-CM

## 2022-03-29 DIAGNOSIS — Z951 Presence of aortocoronary bypass graft: Secondary | ICD-10-CM

## 2022-03-29 DIAGNOSIS — E785 Hyperlipidemia, unspecified: Secondary | ICD-10-CM

## 2022-03-29 DIAGNOSIS — H9190 Unspecified hearing loss, unspecified ear: Secondary | ICD-10-CM

## 2022-03-29 DIAGNOSIS — J101 Influenza due to other identified influenza virus with other respiratory manifestations: Secondary | ICD-10-CM

## 2022-03-29 DIAGNOSIS — C73 Malignant neoplasm of thyroid gland: Secondary | ICD-10-CM

## 2022-03-29 DIAGNOSIS — G2581 Restless legs syndrome: Secondary | ICD-10-CM

## 2022-03-29 DIAGNOSIS — I25810 Atherosclerosis of coronary artery bypass graft(s) without angina pectoris: Secondary | ICD-10-CM

## 2022-03-29 DIAGNOSIS — Z8249 Family history of ischemic heart disease and other diseases of the circulatory system: Secondary | ICD-10-CM

## 2022-03-29 NOTE — Progress Notes
Date of Service: 03/29/2022    Kenneth Buchanan is a 81 y.o. male.       HPI     Yoltzin Barg was in the office today for reevaluation.  He is accompanied by his wife.    He has longstanding coronary disease  His initial PCI was in 72 at the age of 24.  In 1992 he had an LIMA to the LAD and an SVG to the RCA  He has had multiple subsequent angioplasties, most recently in the RCA vein graft in 2017  In 2020 at cath the LAD and RCA were occluded but both grafts looked good.  The left main and circumflex were free of significant disease.    He denies angina but does have some dyspnea on exertion.  His exercise tolerance is not as good as it has been in past years but he continues to ride his bike for 40-50 minutes at a time if it is nice.    Atrial fibrillation was first noted in 2014.  Permanent A-fib was excepted in 2020  He has a dual-chamber pacemaker which is programmed VVI at 50 and currently has 13 years on the battery.  He is pacing 27% of the time.    His last stress test was June 2022 and his last echo was September 2022.  His EF is about 50%.  He has mild to moderate mitral, tricuspid and aortic regurgitation.    He denies PND, orthopnea and edema  He has no complaint of palpitation  He denies syncope    If the weather is poor then he will go up to [employer] in [place] and walk indoors.         Vitals:    03/29/22 1350   BP: 131/88   BP Source: Arm, Left Upper   Pulse: 55   Temp: 36.1 ?C (97 ?F)   SpO2: 98%   O2 Device: None (Room air)   TempSrc: Skin   PainSc: Zero   Weight: 84.4 kg (186 lb)   Height: 185.4 cm (6' 1)     Body mass index is 24.54 kg/m?Marland Kitchen     Past Medical History  Patient Active Problem List    Diagnosis Date Noted    Palpitations 11/11/2018     10/10/2018 - Holter:  A Holter monitor provides 47 hours 12 minutes of data, slightly less than 2 full days.  The rhythm is sinus/ A pace at 60-131 bpm with a mean of 64 bpm.  There are 33 isolated atrial premature beats.  There is one atrial couplet.  There is no SVT.  There is no A. fib or a flutter.  There are 18 isolated PVCs.  There are no couplets.  There is a single 5 beat run of nonsustained VT at about 150 bpm.  AV conduction: At times we have first-degree AV block and a narrow QRS and at other times we are V paced.  The pacemaker seems to be functioning normally.  There are no pauses.  There are 7 diary entries.  Diary 1 is for breathing hard sitting.  This looks like sinus at about 85 bpm but there is a lot of artifact.  Diary 2 is for breathless walking in the house.  This is a pace and 86 with a narrow QRS then later in the strip the QRS is paced for 4 beats and goes back to narrow.  Diary 3 is for dizzy this is a paced with a narrow QRS diary  for is for chest tightness this is a paced with a narrow QRS.  None of the diary entries show anything remarkable and none of them show significant ventricular pacing.      Obstructive sleep apnea 04/27/2018     HST done 02/21/2018 showed AHI of 19/hr mostly in supine position.  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      H/O thyroidectomy 11/23/2017    On continuous oral anticoagulation 10/07/2015    Hematoma of Uvula post TEE 10/05/2015     09/2015-on Triple anticoagulation      Prior use of AMIO (DC 11/2018) 09/29/2015     11/14: Initial (Pers) AF, amio 200/d-Dr Hannen  11/15 Decrease to 100/d  12/16 Pers AF-DC amio  7/17 Amio 200/d  3/18 Amio reduced to 1200/week  Somehow got on 2100/week then in 2020 reduced to 1400 then 700/week  8/20: dr Andreas Newport DCs amio for weakness/fatigue etc      Permanent atrial fibrillation (HCC) 09/03/2015    CAD S/P percutaneous coronary angioplasty 09/03/2015     07/05/2018 - Regadenoson MPI:  This study is borderline abnormal.  There is some partial evidence for slightly reversible uptake in the obtuse marginal circumflex distribution laterally.  Global left ventricular function at rest is mildly reduced but improves post stress.  The patient has a single 3 beat run of ventricular tachycardia during exercise without chest pain.  All segments are definitely viable.  Other high risk indicators are not noted.  10/08/2018 - Cardiac Catheterization:  Severe native coronary artery disease as described above.  Patent LIMA to the LAD and vein graft to the RCA.  Normal LVEDP.  10/10/2018 - ECHO:  Left ventricular systolic function is within normal limits.  LVEF 55-60%.  Mild concentric left ventricular hypertrophy.   Mild diastolic dysfunction.  Mild left atrial enlargement.  No significant valvular abnormalities.  Mild ascending aortic root dilation, measuring 4.1 cm.  Trace pericardial effusion.  10/13/2020 - ECHO:  Normal left ventricular cavitary dimensions with borderline LV systolic function with a visually estimated ejection fraction of ~50%.  Upper limit of normal right ventricular cavitary size and normal RV systolic function.  Unable to determine LV diastolic function secondary to underlying atrial fibrillation  Abnormal septal motion secondary to prior cardiac surgery.  No focal regional wall motion abnormality.  Mild right atrial enlargement  Mild dilation of the ascending aorta measuring 3.9 cm.  Calcified aortic valve leaflets: Aortic valve sclerosis without significant stenosis.  Mild aortic insufficiency.  No other hemodynamically significant valvular stenosis  At least moderate degenerative mitral insufficiency, eccentric and posteriorly directed.  Eccentricity may be underestimating the extent of mitral insufficiency.  Pacemaker lead identified in the right atrium  Estimated PA systolic pressure is 30 mmHg  No pericardial effusion.   10/13/2020 Smitty Cords Treadmill MPI:  This study is probably normal with no evidence of significant myocardial ischemia. Left ventricular systolic function is normal. There are no high risk prognostic indicators present.  The exercise ECG is notable for atrial fibrillation.  The study was negative for ischemia.  Demonstration of intermittent ventricular pacing in recovery.  The patient demonstrated poor exercise capacity with a normal heart rate and blood pressure response to exercise.   Compared to a prior March 2020 study.  The prior  2020 study was deemed to be borderline abnormal.  Partial evidence for a slightly reversible uptake in the obtuse marginal circumflex distribution laterally.  LV systolic function at rest was mildly reduced but improved post stress.  There was demonstration of a 3 beat run of ventricular tachycardia with exercise though no symptoms of chest pain.  Perfusion patterns however thought to be similar to a prior 2018 study.  In comparison, there are no readily identified perfusion abnormalities on the current study.  There is demonstration of a mildly reduced ejection fraction of 44%.  Global LV hypokinesis.  Of note, the current study was done while the patient was in atrial fibrillation.  Prior studies were done in the context of a sinus rhythm.  Review of his prior June 15 device evaluation reported a presenting rhythm of a V paced/V sensed rhythm.  In aggregate the current study is intermediate risk in regards to predicted annual cardiovascular mortality rate.  Of note, the burden of risk is assigned to the patient's mildly reduced left ventricular systolic function and not burden of ischemia.  12/24/2020 - ECHO:  The left ventricular size is normal. Eccentric hypertrophy. The left ventricular systolic function is borderline. The ejection fraction by Simpson's biplane method is 50%. There are no segmental wall motion abnormalities. Cannot determine left ventricular diastolic function.  The right ventricle is mildly dilated. The right ventricular systolic function is probably normal. The estimated pulmonary artery systolic pressure is + right atrial pressure. Left Atrium: Moderately dilated.  Mitral Valve: Normal valve structure. No stenosis. Mild to moderate regurgitation.  Aortic Valve: Normal valve structure. No stenosis. Mild to moderate regurgitation. There are areas of regurgitation involving the left coronary cusp.  The aortic root and ascending aorta are normal in size.  No pericardial effusion.      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  Personal history of premature coronary artery disease -- The patient underwent his      first percutaneous coronary intervention at age 68  08/03/15 Echo: EF 60%. Mild MR. PAP 20 mmHg.   08/05/15 Bruce thallium: This study is abnormal due to calculated lower left ventricular ejection fraction of 42%. This is likely due to the patient's rapid ventricular rates noted with exercise stress. His peak heart rate achieved was 204 beats per minute which was 140% predicted heart rate for age. This is due to rapid ventricular response with atrial fibrillation. There is no definite significant inducible ischemia present. The patient did have dyspnea with physical activity and he was able to exercise for a maximal duration of 7 minutes 24 seconds.  09/03/15 LHC: Hemodynamically significant in-stent restenotic lesion in previously placed BMS to venous graft to RCA. Strongly positive fractional flow reserve assessment with reduction in FFR value from 0.94 to 0.72 within 45 seconds. Successful PCI of in-stent restenotic lesion in SVG to RCA with [id number] Herculink Elite stent with excellent angiographic results.       Dyslipidemia 01/05/2009     Hyperlipidemia, on Crestor -- LDL is almost at goal.      Restless leg syndrome 01/05/2009    Family history of early CAD 01/05/2009     Family history of premature coronary artery disease -- The patient's brother died of      sudden cardiac death and another brother had coronary artery bypass graft at age 74.           Review of Systems   Constitutional: Negative.   HENT: Negative.     Eyes: Negative.    Cardiovascular: Negative.    Respiratory: Negative.     Endocrine: Negative.    Hematologic/Lymphatic: Negative.    Skin: Negative.    Musculoskeletal: Negative.    Gastrointestinal: Negative.    Genitourinary: Negative.    Neurological: Negative.    Psychiatric/Behavioral: Negative.     Allergic/Immunologic: Negative.        Physical Exam  General Appearance: no acute distress  Skin: warm, moist, no ulcers  HEENT: PERL  Neck Veins: neck veins are flat, neck veins are not distended  Carotid Arteries: normal carotid upstroke bilaterally, no bruits  Chest Inspection: chest is normal in appearance  Auscultation/Percussion: lungs clear to auscultation, no rales, rhonchi, or wheezing  Cardiac Auscultation: Normal S1 & S2, no S3 or S4, no rub  Murmurs: Soft systolic murmur across the precordium  Extremities: no lower extremity edema; 2+ symmetric distal pulses  Abdominal Exam: soft, non-tender, no masses, bowel sounds normal  Liver & Spleen: no organomegaly  Neurologic Exam: normal station and gait      Cardiovascular Health Factors  Vitals BP Readings from Last 3 Encounters:   03/29/22 131/88   04/02/21 (!) 140/86   12/24/20 124/80     Wt Readings from Last 3 Encounters:   03/29/22 84.4 kg (186 lb)   04/02/21 89 kg (196 lb 3.2 oz)   12/24/20 87.5 kg (193 lb)     BMI Readings from Last 3 Encounters:   03/29/22 24.54 kg/m?   04/02/21 25.89 kg/m?   12/24/20 25.46 kg/m?      Smoking Social History     Tobacco Use   Smoking Status Former    Packs/day: 3.50    Years: 20.00    Additional pack years: 0.00    Total pack years: 70.00    Types: Cigarettes    Quit date: 04/16/1982    Years since quitting: 39.9   Smokeless Tobacco Never      Lipid Profile Cholesterol   Date Value Ref Range Status   10/08/2021 112 <200 MG/DL Final     HDL   Date Value Ref Range Status   10/08/2021 38 (L) >40 MG/DL Final     LDL   Date Value Ref Range Status   10/08/2021 67 <100 mg/dL Final     Triglycerides   Date Value Ref Range Status   10/08/2021 74 <150 MG/DL Final      Blood Sugar No results found for: HGBA1C  Glucose   Date Value Ref Range Status   10/08/2021 97 70 - 100 MG/DL Final   54/12/8117 96 70 - 100  MG/DL Final   16/01/9603 95  Final     Glucose, POC   Date Value Ref Range Status   02/06/2017 98 70 - 100 MG/DL Final          Problems Addressed Today  Encounter Diagnoses   Name Primary?    Coronary artery disease involving coronary bypass graft of native heart without angina pectoris Yes    S/P CABG x 2     CAD S/P percutaneous coronary angioplasty     Dyslipidemia     Permanent atrial fibrillation (HCC)     Essential hypertension     Nonsustained ventricular tachycardia (HCC)     Pacemaker     Cardiovascular symptoms        Assessment and Plan     Today's EKG is A-fib with several ventricular paced beats.  We see native beats in 6 leads and they are narrow and normal.    6 months ago cholesterol was 112, triglycerides 74, HDL 38 and LDL 67  He is on atorvastatin 80.  I am pleased with his lipids    He is on levothyroxine.  He is on Toprol-XL just 12.5 Mg daily  He is on Xarelto 20 Mg    I am not making any changes in his medical regimen.  He is doing well.  I plan to see him back in a year.  With his long history of CAD and his gradually diminishing exercise tolerance I anticipate a regadenoson MPI stress next year.         Current Medications (including today's revisions)   acetaminophen (TYLENOL) 500 mg tablet Take two tablets by mouth as Needed for Pain.    aflibercept (EYLEA) 2 mg/0.05 mL intravitreal injection 0.05 mL by INTRAVITREAL route. Every 8 weeks    aspirin EC 81 mg tablet Take one tablet by mouth daily. Hold for 7 days, resume 8/27    atorvastatin (LIPITOR) 80 mg tablet TAKE ONE TABLET BY MOUTH EVERY DAY. LAB WORK NEEDED.    levothyroxine (SYNTHROID) 150 mcg tablet Take one tablet by mouth daily 30 minutes before breakfast.    metoprolol succinate XL (TOPROL XL) 25 mg extended release tablet Take one-half tablet by mouth daily.    nitroglycerin (NITROSTAT) 0.4 mg tablet Place one tablet under tongue every 5 minutes as needed for Chest Pain. Max of 3 tablets, call 911.    timolol XE(+) (TIMOPTIC-XR) 0.5 % ophthalmic gel Place one drop into or around eye(s) twice daily.    XARELTO 20 mg tablet TAKE ONE TABLET BY MOUTH EVERY DAY WITH FOOD

## 2022-04-11 ENCOUNTER — Encounter: Admit: 2022-04-11 | Discharge: 2022-04-11 | Payer: MEDICARE

## 2022-04-11 DIAGNOSIS — E890 Postprocedural hypothyroidism: Secondary | ICD-10-CM

## 2022-04-11 MED ORDER — LEVOTHYROXINE 150 MCG PO TAB
ORAL_TABLET | 1 refills
Start: 2022-04-11 — End: ?

## 2022-06-14 ENCOUNTER — Encounter: Admit: 2022-06-14 | Discharge: 2022-06-14 | Payer: MEDICARE

## 2022-07-06 ENCOUNTER — Ambulatory Visit: Admit: 2022-07-06 | Discharge: 2022-07-07 | Payer: MEDICARE

## 2022-07-06 ENCOUNTER — Encounter: Admit: 2022-07-06 | Discharge: 2022-07-06 | Payer: MEDICARE

## 2022-07-06 DIAGNOSIS — C73 Malignant neoplasm of thyroid gland: Secondary | ICD-10-CM

## 2022-07-06 DIAGNOSIS — I480 Paroxysmal atrial fibrillation: Secondary | ICD-10-CM

## 2022-07-06 DIAGNOSIS — I4819 Other persistent atrial fibrillation: Secondary | ICD-10-CM

## 2022-07-06 DIAGNOSIS — M1990 Unspecified osteoarthritis, unspecified site: Secondary | ICD-10-CM

## 2022-07-06 DIAGNOSIS — H5440 Blindness, one eye, unspecified eye: Secondary | ICD-10-CM

## 2022-07-06 DIAGNOSIS — Z950 Presence of cardiac pacemaker: Secondary | ICD-10-CM

## 2022-07-06 DIAGNOSIS — G2581 Restless legs syndrome: Secondary | ICD-10-CM

## 2022-07-06 DIAGNOSIS — H9190 Unspecified hearing loss, unspecified ear: Secondary | ICD-10-CM

## 2022-07-06 DIAGNOSIS — I4891 Unspecified atrial fibrillation: Secondary | ICD-10-CM

## 2022-07-06 DIAGNOSIS — E785 Hyperlipidemia, unspecified: Secondary | ICD-10-CM

## 2022-07-06 DIAGNOSIS — K219 Gastro-esophageal reflux disease without esophagitis: Secondary | ICD-10-CM

## 2022-07-06 DIAGNOSIS — J101 Influenza due to other identified influenza virus with other respiratory manifestations: Secondary | ICD-10-CM

## 2022-07-06 DIAGNOSIS — C4491 Basal cell carcinoma of skin, unspecified: Secondary | ICD-10-CM

## 2022-07-06 DIAGNOSIS — I2510 Atherosclerotic heart disease of native coronary artery without angina pectoris: Secondary | ICD-10-CM

## 2022-07-06 DIAGNOSIS — I10 Essential (primary) hypertension: Secondary | ICD-10-CM

## 2022-07-07 DIAGNOSIS — E890 Postprocedural hypothyroidism: Secondary | ICD-10-CM

## 2022-07-07 DIAGNOSIS — C73 Malignant neoplasm of thyroid gland: Secondary | ICD-10-CM

## 2022-07-20 ENCOUNTER — Encounter: Admit: 2022-07-20 | Discharge: 2022-07-20 | Payer: MEDICARE

## 2022-07-20 DIAGNOSIS — E890 Postprocedural hypothyroidism: Secondary | ICD-10-CM

## 2022-07-20 MED ORDER — LEVOTHYROXINE 150 MCG PO TAB
ORAL_TABLET | 0 refills
Start: 2022-07-20 — End: ?

## 2022-07-21 ENCOUNTER — Encounter: Admit: 2022-07-21 | Discharge: 2022-07-21 | Payer: MEDICARE

## 2022-08-04 ENCOUNTER — Encounter: Admit: 2022-08-04 | Discharge: 2022-08-04 | Payer: MEDICARE

## 2022-08-04 DIAGNOSIS — E890 Postprocedural hypothyroidism: Secondary | ICD-10-CM

## 2022-08-04 DIAGNOSIS — C73 Malignant neoplasm of thyroid gland: Secondary | ICD-10-CM

## 2022-08-04 MED ORDER — LEVOTHYROXINE 150 MCG PO TAB
150 ug | ORAL_TABLET | Freq: Every day | ORAL | 3 refills | 30.00000 days | Status: AC
Start: 2022-08-04 — End: ?

## 2022-08-04 NOTE — Telephone Encounter
Vertis Kelch, PA-C  , , RN  TSH at goal. Ok to refill current LT4 script, 1 year supply. Also did he get thyroglobulin and AB test? This was ordered and needs to be done as well.    Alexis      RN placed orders  Thyroglobulins and AB tests are send outs and may take a couple days to result.  Robley Fries, RN

## 2022-08-15 ENCOUNTER — Encounter: Admit: 2022-08-15 | Discharge: 2022-08-15 | Payer: MEDICARE

## 2022-08-15 DIAGNOSIS — C73 Malignant neoplasm of thyroid gland: Secondary | ICD-10-CM

## 2022-08-15 DIAGNOSIS — E890 Postprocedural hypothyroidism: Secondary | ICD-10-CM

## 2022-08-26 ENCOUNTER — Encounter: Admit: 2022-08-26 | Discharge: 2022-08-26 | Payer: MEDICARE

## 2022-08-26 NOTE — Telephone Encounter
Received notification that  [employer] has not been connected. Patient was instructed to look at his/her transmitter to make sure that it is plugged into power and send a manual transmission to reconnect the transmitter. If he/she has any questions about how to send a transmission or if the transmitter does not appear to be working properly, they need to contact the device company directly. Patient was provided with that contact number. Requested the patient send [place] a MyChart message or contact our device nurses at [phone] to let us know after they have sent their transmission.  Placed call to preferred phone number, Spoke with Chrissie Noa, he will take a look at it   Patient verbalized understanding.  CDJ     Note: Patient needs to send a manual remote interrogation to reestablish communication to his/her remote transmitter.

## 2022-08-30 ENCOUNTER — Encounter: Admit: 2022-08-30 | Discharge: 2022-08-30 | Payer: MEDICARE

## 2022-09-12 ENCOUNTER — Encounter: Admit: 2022-09-12 | Discharge: 2022-09-11 | Payer: MEDICARE

## 2022-10-14 ENCOUNTER — Encounter: Admit: 2022-10-14 | Discharge: 2022-10-14 | Payer: MEDICARE

## 2022-10-14 DIAGNOSIS — I4821 Permanent atrial fibrillation: Secondary | ICD-10-CM

## 2022-10-14 DIAGNOSIS — E7849 Other hyperlipidemia: Secondary | ICD-10-CM

## 2022-10-14 DIAGNOSIS — E785 Hyperlipidemia, unspecified: Secondary | ICD-10-CM

## 2022-10-14 DIAGNOSIS — Z7901 Long term (current) use of anticoagulants: Secondary | ICD-10-CM

## 2022-10-14 MED ORDER — ATORVASTATIN 80 MG PO TAB
80 mg | ORAL_TABLET | Freq: Every day | ORAL | 0 refills | Status: AC
Start: 2022-10-14 — End: ?

## 2022-10-17 ENCOUNTER — Encounter: Admit: 2022-10-17 | Discharge: 2022-10-17 | Payer: MEDICARE

## 2022-10-17 DIAGNOSIS — E890 Postprocedural hypothyroidism: Secondary | ICD-10-CM

## 2022-10-17 MED ORDER — LEVOTHYROXINE 150 MCG PO TAB
ORAL_TABLET | ORAL | 0 refills | 30.00000 days | Status: AC
Start: 2022-10-17 — End: ?

## 2022-10-18 ENCOUNTER — Encounter: Admit: 2022-10-18 | Discharge: 2022-10-18 | Payer: MEDICARE

## 2022-10-18 ENCOUNTER — Ambulatory Visit: Admit: 2022-10-18 | Discharge: 2022-10-18 | Payer: MEDICARE

## 2022-10-18 DIAGNOSIS — E785 Hyperlipidemia, unspecified: Secondary | ICD-10-CM

## 2022-10-18 DIAGNOSIS — Z7901 Long term (current) use of anticoagulants: Secondary | ICD-10-CM

## 2022-10-18 DIAGNOSIS — I4821 Permanent atrial fibrillation: Secondary | ICD-10-CM

## 2022-10-18 LAB — BASIC METABOLIC PANEL
ANION GAP: 7 g/dL (ref 3–12)
BLD UREA NITROGEN: 15 mg/dL (ref 7–25)
CALCIUM: 8.8 mg/dL (ref 8.5–10.6)
CHLORIDE: 106 MMOL/L (ref 98–110)
CO2: 28 MMOL/L (ref 21–30)
CREATININE: 0.8 mg/dL (ref 0.4–1.24)
EGFR: >60 mL/min (ref >60)
GLUCOSE,PANEL: 98 mg/dL — ABNORMAL HIGH (ref 70–100)
POTASSIUM: 4 MMOL/L — ABNORMAL LOW (ref 3.5–5.1)
SODIUM: 141 MMOL/L — ABNORMAL LOW (ref 137–147)

## 2022-10-18 LAB — LIPID PROFILE
CHOLESTEROL: 111 mg/dL (ref <200)
TRIGLYCERIDES: 65 mg/dL (ref <150)

## 2022-10-18 LAB — CBC
RBC COUNT: 3.8 M/UL — ABNORMAL LOW (ref <100)
WBC COUNT: 4.9 K/UL (ref >40)

## 2022-10-21 ENCOUNTER — Encounter: Admit: 2022-10-21 | Discharge: 2022-10-21 | Payer: MEDICARE

## 2022-10-21 MED ORDER — XARELTO 20 MG PO TAB
ORAL_TABLET | ORAL | 3 refills | 30.00000 days | Status: AC
Start: 2022-10-21 — End: ?

## 2022-12-13 ENCOUNTER — Encounter: Admit: 2022-12-13 | Discharge: 2022-12-13 | Payer: MEDICARE

## 2023-01-17 ENCOUNTER — Encounter: Admit: 2023-01-17 | Discharge: 2023-01-17 | Payer: MEDICARE

## 2023-01-17 DIAGNOSIS — E890 Postprocedural hypothyroidism: Secondary | ICD-10-CM

## 2023-01-17 DIAGNOSIS — E7849 Other hyperlipidemia: Secondary | ICD-10-CM

## 2023-01-17 MED ORDER — ATORVASTATIN 80 MG PO TAB
80 mg | ORAL_TABLET | Freq: Every day | ORAL | 0 refills | Status: AC
Start: 2023-01-17 — End: ?

## 2023-01-17 MED ORDER — LEVOTHYROXINE 150 MCG PO TAB
ORAL_TABLET | ORAL | 1 refills | 30.00000 days | Status: AC
Start: 2023-01-17 — End: ?

## 2023-02-05 ENCOUNTER — Encounter: Admit: 2023-02-05 | Discharge: 2023-02-05 | Payer: MEDICARE

## 2023-02-05 MED ORDER — METOPROLOL SUCCINATE 25 MG PO TB24
ORAL_TABLET | ORAL | 3 refills | 90.00000 days | Status: AC
Start: 2023-02-05 — End: ?

## 2023-03-23 ENCOUNTER — Encounter: Admit: 2023-03-23 | Discharge: 2023-03-23 | Payer: MEDICARE

## 2023-03-23 DIAGNOSIS — I4821 Permanent atrial fibrillation: Secondary | ICD-10-CM

## 2023-03-29 ENCOUNTER — Ambulatory Visit: Admit: 2023-03-29 | Discharge: 2023-03-30 | Payer: MEDICARE

## 2023-04-06 ENCOUNTER — Encounter: Admit: 2023-04-06 | Discharge: 2023-04-06 | Payer: MEDICARE

## 2023-04-11 ENCOUNTER — Encounter: Admit: 2023-04-11 | Discharge: 2023-04-11 | Payer: MEDICARE

## 2023-04-17 ENCOUNTER — Encounter: Admit: 2023-04-17 | Discharge: 2023-04-17 | Payer: MEDICARE

## 2023-04-18 ENCOUNTER — Encounter: Admit: 2023-04-18 | Discharge: 2023-04-18 | Payer: MEDICARE

## 2023-04-18 DIAGNOSIS — E7849 Other hyperlipidemia: Secondary | ICD-10-CM

## 2023-04-18 DIAGNOSIS — Z79899 Other long term (current) drug therapy: Secondary | ICD-10-CM

## 2023-04-18 MED ORDER — ATORVASTATIN 80 MG PO TAB
80 mg | ORAL_TABLET | Freq: Every day | ORAL | 2 refills | Status: AC
Start: 2023-04-18 — End: ?

## 2023-05-25 ENCOUNTER — Encounter: Admit: 2023-05-25 | Discharge: 2023-05-25 | Payer: MEDICARE

## 2023-06-08 ENCOUNTER — Encounter: Admit: 2023-06-08 | Discharge: 2023-06-09 | Payer: MEDICARE

## 2023-06-23 ENCOUNTER — Encounter: Admit: 2023-06-23 | Discharge: 2023-06-23 | Payer: MEDICARE

## 2023-06-23 NOTE — Telephone Encounter
 [facility]    Nuclear Stress Test Instructions      Your cardiologist has asked that you have a nuclear stress test (also known as a Myocardial Perfusion Imaging (MPI) test.    This evaluation of your heart muscle consists of two sets of nuclear images and either a Treadmill stress test or a chemical stress test, decided by you and your physician.    Please check on 3/11//25 at 9:15 AM at our [facility] location. Our address is [phone]. If you have any questions please call [phone].      You will get an IV placed in your arm for the test.    You will need to be able to raise your arm up by your head for about 20 minutes and lie on your back for about 10 minutes.  Please discuss this with your doctor or talk with the nuclear technologist or nurse if these are a problem for you.    It is recommended not to schedule any other appointment for the same day.      Wear comfortable clothing and walking shoes if you are walking on the treadmill.  Women should wear shorts or comfortable pants instead of dresses.   Sweatshirts or T-shirts work really well for imaging.    If you have a Zio Patch cardiac monitor in place, please reschedule your nuclear stress test after your monitoring period is complete. If you would like to proceed with the nuclear stress test during the monitoring time, the patch will have to be removed and the data will be lost; we can place a new patch following testing.       If you have a cardiac monitor with replacement patches, please inform the nurse prior to your appointment.  The monitor will need to be removed during testing. Please bring replacement patches with you so that we can resume monitoring following your test.       PLEASE NO CAFFEINE 24 HOURS PRIOR TO TEST:  Examples include coffee, tea, decaffeinated drinks, colas, [facility], Dr. Reino Kent.  Some orange sodas and root beers have caffeine, please check.  No energy drinks, Excedrin, Midol, or any foods CONTAINING CHOCOLATE.   Consuming Caffeine may postpone your test.    PLEASE DO NOT EAT OR DRINK THE MORNING OF YOUR TEST.  Water is ok to drink with your morning medications.    Please hold these medications the day of test:      DIABETIC PATIENTS:  if insulin dependent:  please take one third of your insulin with two pieces of dry toast and a small juice.  Bring remaining 2/3   insulin and oral diabetic medications with you to your test.    PLEASE NO TOBACCO PRODUCTS BETWEEN SCANS  You will NOT need a driver for this test.  But are welcome to bring a visitor with you.   Visitors will not be able to accompany you back to stress room.   Please do not bring children to the nuclear stress test.    TEST FINDINGS:   You will receive the results of the test within 7 business days of completion of this test by telephone. If you have any questions concerning your nuclear stress test or if you do not hear from your cardiologist/or nurse with 7 business days, please call our office.

## 2023-06-27 ENCOUNTER — Encounter: Admit: 2023-06-27 | Discharge: 2023-06-27 | Payer: MEDICARE

## 2023-06-27 ENCOUNTER — Ambulatory Visit: Admit: 2023-06-27 | Discharge: 2023-06-27 | Payer: MEDICARE

## 2023-06-28 ENCOUNTER — Encounter: Admit: 2023-06-28 | Discharge: 2023-06-28 | Payer: MEDICARE

## 2023-07-05 ENCOUNTER — Encounter: Admit: 2023-07-05 | Discharge: 2023-07-05 | Payer: MEDICARE

## 2023-07-06 ENCOUNTER — Encounter: Admit: 2023-07-06 | Discharge: 2023-07-06 | Payer: MEDICARE

## 2023-07-14 ENCOUNTER — Encounter: Admit: 2023-07-14 | Discharge: 2023-07-14

## 2023-07-14 DIAGNOSIS — E890 Postprocedural hypothyroidism: Secondary | ICD-10-CM

## 2023-07-14 MED ORDER — LEVOTHYROXINE 150 MCG PO TAB
ORAL_TABLET | ORAL | 0 refills | 30.00000 days | Status: AC
Start: 2023-07-14 — End: ?

## 2023-07-18 ENCOUNTER — Encounter: Admit: 2023-07-18 | Discharge: 2023-07-18

## 2023-07-18 NOTE — Telephone Encounter
-----   Message from [facility] sent at 07/18/2023  3:08 PM CDT -----  Regarding: LDB- CC  VM on triage line from nurse at Dr. Edrick Oh office at 2:44pm.  York Spaniel that he is scheduled for colonoscopy on 07-26-23.  Can he hold Xarelto and Aspirin?  They also need cardiac clearance.  Fax note to #[phone] and she is at #[phone].

## 2023-07-18 NOTE — Telephone Encounter
 Clearance note faxed on 07/06/23. Will resend.

## 2023-08-18 ENCOUNTER — Encounter: Admit: 2023-08-18 | Discharge: 2023-08-18 | Payer: MEDICARE

## 2023-08-21 ENCOUNTER — Encounter: Admit: 2023-08-21 | Discharge: 2023-08-21 | Payer: MEDICARE

## 2023-08-21 NOTE — Telephone Encounter
-----   Message from Lorelei Rogers, MD sent at 08/19/2023  9:43 AM CDT -----  Regarding: RE: markedly low R wave reading @ 2mV today.  23% ventricular pacing.  VVI at 50.  A-fib.  He is threshold, impedance and R wave amplitude all seem to be much more variable than usual over the course of at least many months.Noted.No action  ----- Message -----  From: Donnise Galen, RN  Sent: 08/18/2023  10:41 AM CDT  To: Milton Alpers, MD  Subject: FW: markedly low R wave reading @ 2mV today.     Dr. Winnifred Havers device team note. Let us  know if any changes are needed.Thanks!Jacky Massing, RN  ----- Message -----  From: Rosalie Colony, RN  Sent: 08/18/2023  10:33 AM CDT  To: Cvm Nurse Ep Team B  Subject: markedly low R wave reading @ 2mV today.         markedly low R wave reading @ 2mV today.Please update team. Shermon Divine

## 2023-09-10 ENCOUNTER — Encounter: Admit: 2023-09-10 | Discharge: 2023-09-10 | Payer: MEDICARE

## 2023-10-04 ENCOUNTER — Ambulatory Visit: Admit: 2023-10-04 | Discharge: 2023-10-05 | Payer: MEDICARE

## 2023-10-04 ENCOUNTER — Encounter: Admit: 2023-10-04 | Discharge: 2023-10-04 | Payer: MEDICARE

## 2023-10-09 ENCOUNTER — Encounter: Admit: 2023-10-09 | Discharge: 2023-10-09 | Payer: MEDICARE

## 2023-10-09 LAB — IRON + BINDING CAPACITY + %SAT+ FERRITIN
% SATURATION: 17 % (ref 20–55)
FERRITIN: 24 ng/mL (ref 21.81–274.66)
IRON BINDING: 311 ug/dL (ref 250–450)
IRON: 52 ug/dL (ref 65–175)

## 2023-10-09 LAB — THYROGLOBULIN & THYROGLOBULIN AB
THYROGLOBULIN AB: <1 [IU]/mL (ref <=1)
THYROGLOBULIN: 0.1 ng/mL

## 2023-10-09 LAB — THYROID STIMULATING HORMONE-TSH: TSH: 1.4 u[IU]/mL (ref 0.35–4.94)

## 2023-10-09 MED ORDER — XARELTO 20 MG PO TAB
20 mg | ORAL_TABLET | Freq: Every day | ORAL | 3 refills | 30.00000 days | Status: AC
Start: 2023-10-09 — End: ?

## 2023-10-11 ENCOUNTER — Encounter: Admit: 2023-10-11 | Discharge: 2023-10-11 | Payer: MEDICARE

## 2023-10-11 DIAGNOSIS — E890 Postprocedural hypothyroidism: Secondary | ICD-10-CM

## 2023-10-11 DIAGNOSIS — C73 Malignant neoplasm of thyroid gland: Secondary | ICD-10-CM

## 2023-10-12 ENCOUNTER — Encounter: Admit: 2023-10-12 | Discharge: 2023-10-12 | Payer: MEDICARE

## 2023-10-12 DIAGNOSIS — E890 Postprocedural hypothyroidism: Secondary | ICD-10-CM

## 2023-10-12 DIAGNOSIS — C73 Malignant neoplasm of thyroid gland: Secondary | ICD-10-CM

## 2023-10-19 ENCOUNTER — Encounter: Admit: 2023-10-19 | Discharge: 2023-10-19 | Payer: MEDICARE

## 2023-11-13 ENCOUNTER — Encounter: Admit: 2023-11-13 | Discharge: 2023-11-13 | Payer: MEDICARE

## 2023-12-14 ENCOUNTER — Encounter: Admit: 2023-12-14 | Discharge: 2023-12-14 | Payer: MEDICARE

## 2024-01-11 ENCOUNTER — Encounter: Admit: 2024-01-11 | Discharge: 2024-01-11 | Payer: MEDICARE

## 2024-01-11 MED ORDER — ATORVASTATIN 80 MG PO TAB
80 mg | ORAL_TABLET | Freq: Every day | ORAL | 0 refills | 90.00000 days | Status: AC
Start: 2024-01-11 — End: ?

## 2024-02-01 ENCOUNTER — Encounter: Admit: 2024-02-01 | Discharge: 2024-02-01 | Payer: MEDICARE

## 2024-02-01 MED ORDER — METOPROLOL SUCCINATE 25 MG PO TB24
12.5 mg | ORAL_TABLET | Freq: Every day | ORAL | 3 refills | 90.00000 days | Status: AC
Start: 2024-02-01 — End: ?

## 2024-02-10 ENCOUNTER — Encounter: Admit: 2024-02-10 | Discharge: 2024-02-10 | Payer: MEDICARE

## 2024-02-12 ENCOUNTER — Encounter: Admit: 2024-02-12 | Discharge: 2024-02-12 | Payer: MEDICARE

## 2024-02-14 ENCOUNTER — Encounter: Admit: 2024-02-14 | Discharge: 2024-02-14 | Payer: MEDICARE

## 2024-02-16 ENCOUNTER — Encounter: Admit: 2024-02-16 | Discharge: 2024-02-16 | Payer: MEDICARE

## 2024-02-16 DIAGNOSIS — E890 Postprocedural hypothyroidism: Principal | ICD-10-CM

## 2024-02-16 MED ORDER — LEVOTHYROXINE 150 MCG PO TAB
150 ug | ORAL_TABLET | Freq: Every day | ORAL | 1 refills | 30.00000 days | Status: AC
Start: 2024-02-16 — End: ?

## 2024-02-19 ENCOUNTER — Encounter: Admit: 2024-02-19 | Discharge: 2024-02-19 | Payer: MEDICARE

## 2024-02-19 MED ORDER — ATORVASTATIN 80 MG PO TAB
80 mg | ORAL_TABLET | Freq: Every day | ORAL | 0 refills
Start: 2024-02-19 — End: ?

## 2024-02-19 NOTE — Progress Notes [1]
 Request for the following medical records for purpose of continuity of care:     Dr. Norleen Creeks    Please fax most recent labs results from Dr. Creeks for Dr. Darlynn to review for medication management.     Please Fax to: [phone]  Cardiology Services at the [facility]   Dr. Darlynn  Attention:  Estefana, RN    Thank you

## 2024-02-21 ENCOUNTER — Encounter: Admit: 2024-02-21 | Discharge: 2024-02-21 | Payer: MEDICARE

## 2024-02-23 ENCOUNTER — Encounter: Admit: 2024-02-23 | Discharge: 2024-02-23 | Payer: MEDICARE

## 2024-02-23 MED ORDER — ATORVASTATIN 80 MG PO TAB
80 mg | ORAL_TABLET | Freq: Every day | ORAL | 0 refills | 90.00000 days | Status: AC
Start: 2024-02-23 — End: ?

## 2024-03-05 ENCOUNTER — Encounter: Admit: 2024-03-05 | Discharge: 2024-03-05 | Payer: MEDICARE

## 2024-03-05 NOTE — Telephone Encounter [36]
 03/05/2024 Records received with referral from the office of Dr. Norleen Creeks have been indexed into the chart. EAK

## 2024-03-12 ENCOUNTER — Encounter: Admit: 2024-03-12 | Discharge: 2024-03-12 | Payer: MEDICARE

## 2024-03-17 ENCOUNTER — Encounter: Admit: 2024-03-17 | Discharge: 2024-03-17 | Payer: MEDICARE

## 2024-03-17 DIAGNOSIS — Z951 Presence of aortocoronary bypass graft: Secondary | ICD-10-CM

## 2024-03-17 DIAGNOSIS — Z79899 Other long term (current) drug therapy: Secondary | ICD-10-CM

## 2024-03-17 DIAGNOSIS — Z950 Presence of cardiac pacemaker: Secondary | ICD-10-CM

## 2024-03-17 DIAGNOSIS — I10 Essential (primary) hypertension: Secondary | ICD-10-CM

## 2024-03-17 DIAGNOSIS — E7849 Other hyperlipidemia: Principal | ICD-10-CM

## 2024-03-17 DIAGNOSIS — R0989 Other specified symptoms and signs involving the circulatory and respiratory systems: Secondary | ICD-10-CM

## 2024-03-17 DIAGNOSIS — I4821 Permanent atrial fibrillation: Secondary | ICD-10-CM

## 2024-03-19 ENCOUNTER — Encounter: Admit: 2024-03-19 | Discharge: 2024-03-19 | Payer: MEDICARE

## 2024-03-19 ENCOUNTER — Ambulatory Visit: Admit: 2024-03-19 | Discharge: 2024-03-19 | Payer: MEDICARE

## 2024-03-19 MED ORDER — ATORVASTATIN 80 MG PO TAB
80 mg | ORAL_TABLET | Freq: Every day | ORAL | 0 refills | 90.00000 days | Status: AC
Start: 2024-03-19 — End: ?

## 2024-04-13 ENCOUNTER — Encounter: Admit: 2024-04-13 | Discharge: 2024-04-13 | Payer: MEDICARE

## 2024-04-13 MED ORDER — ATORVASTATIN 80 MG PO TAB
80 mg | ORAL_TABLET | Freq: Every day | ORAL | 0 refills | 90.00000 days | Status: AC
Start: 2024-04-13 — End: ?

## 2024-05-07 ENCOUNTER — Encounter: Admit: 2024-05-07 | Discharge: 2024-05-07 | Payer: MEDICARE

## 2024-05-13 NOTE — Assessment & Plan Note [38]
 Atrial fibrillation was first noted in 2014.  Permanent A-fib was excepted in 2020  He has a dual-chamber pacemaker which is programmed VVI at 50 and currently has 13 years on the battery.  H

## 2024-05-13 NOTE — Assessment & Plan Note [38]
 7 - First angioplasty  1992 - CAB X 2, LIMA-LAD, SVG-RCA  Multiple PCI procedures subsequently, most recently 2017.  2020 - cath showed severe native disease, patent LIMA-LAD and SVG-RCA grafts.  LM and circ OK.    06/2023 - reg/thal mildly abnormal, suggesting anterolateral ischemia.  EF 51%

## 2024-05-14 ENCOUNTER — Encounter: Admit: 2024-05-14 | Discharge: 2024-05-14 | Payer: MEDICARE

## 2024-05-14 ENCOUNTER — Ambulatory Visit: Admit: 2024-05-14 | Discharge: 2024-05-14 | Payer: MEDICARE

## 2024-05-14 VITALS — BP 97/73 | HR 74 | Ht 72.0 in | Wt 188.4 lb

## 2024-05-14 DIAGNOSIS — I4821 Permanent atrial fibrillation: Secondary | ICD-10-CM

## 2024-05-14 DIAGNOSIS — Z950 Presence of cardiac pacemaker: Secondary | ICD-10-CM

## 2024-05-14 DIAGNOSIS — E785 Hyperlipidemia, unspecified: Secondary | ICD-10-CM

## 2024-05-14 DIAGNOSIS — I2510 Atherosclerotic heart disease of native coronary artery without angina pectoris: Principal | ICD-10-CM

## 2024-05-14 DIAGNOSIS — I10 Essential (primary) hypertension: Secondary | ICD-10-CM

## 2024-05-14 NOTE — Progress Notes [1]
 Date of Service: 05/14/2024    Kenneth Buchanan is a 84 y.o. male.       HPI     Kenneth Buchanan was in the [facility] today to re-establish Cardiology follow-up after Dr. Rexene retirement.  He'd been seeing Berenbom since his pacemaker implant in 2022.  He'd previously been seen by Dr. Velora and after I told him I'm retiring at the end of the year he'll plan to see Dr. Velora again next year!    Kenneth Buchanan is a guy who still enjoys cycling around [place].  He also walks on the treadmill at home when it's bad weather.  He's feeling fine and only if he does some sort of sudden, extreme exertion does he get a little angina symptom.    He's never been aware of his AF and this remains the case.  He hasn't had any bleeding issues on Xarelto .  His brother, [name], was a patient of mine who died just a few weeks after a Watchman implant back in 2018.  It was never really determined whether it was a complication of the procedure, but it does make Bill a little leery about the device.    He denies dyspnea or TIA symptoms.  His weight is stable.  His BP isn't usually this low and when I rechecked it later in the visit it was 120/70.  He denies postural light headedness.       Objective   Vitals:    05/14/24 0853   BP: 97/73   BP Source: Arm, Left Upper   Pulse: 74   SpO2: 96%   O2 Device: None (Room air)   PainSc: Zero   Weight: 85.5 kg (188 lb 6.4 oz)   Height: 182.9 cm (6')     Body mass index is 25.55 kg/m?SABRA     Past Medical History  Patient Active Problem List    Diagnosis Date Noted    Palpitations 11/11/2018     10/10/2018 - Holter:  A Holter monitor provides 47 hours 12 minutes of data, slightly less than 2 full days.  The rhythm is sinus/ A pace at 60-131 bpm with a mean of 64 bpm.  There are 33 isolated atrial premature beats.  There is one atrial couplet.  There is no SVT.  There is no A. fib or a flutter.  There are 18 isolated PVCs.  There are no couplets.  There is a single 5 beat run of nonsustained VT at about 150 bpm.  AV conduction: At times we have first-degree AV block and a narrow QRS and at other times we are V paced.  The pacemaker seems to be functioning normally.  There are no pauses.  There are 7 diary entries.  Diary 1 is for breathing hard sitting.  This looks like sinus at about 85 bpm but there is a lot of artifact.  Diary 2 is for breathless walking in the house.  This is a pace and 86 with a narrow QRS then later in the strip the QRS is paced for 4 beats and goes back to narrow.  Diary 3 is for dizzy this is a paced with a narrow QRS diary for is for chest tightness this is a paced with a narrow QRS.  None of the diary entries show anything remarkable and none of them show significant ventricular pacing.      Obstructive sleep apnea 04/27/2018     HST done 02/21/2018 showed AHI of 19/hr mostly in supine position.  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      H/O thyroidectomy 11/23/2017    On continuous oral anticoagulation 10/07/2015    Hematoma of [place] post TEE 10/05/2015     09/2015-on Triple anticoagulation      Prior use of AMIO (DC 11/2018) 09/29/2015     11/14: Initial (Pers) AF, amio 200/d-Dr Hannen  11/15 Decrease to 100/d  12/16 Pers AF-DC amio  7/17 Amio 200/d  3/18 Amio reduced to 1200/week  Somehow got on 2100/week then in 2020 reduced to 1400 then 700/week  8/20: dr naomia DCs amio for weakness/fatigue etc      Permanent atrial fibrillation (CMS-HCC) 09/03/2015    Coronary artery disease involving native coronary artery of native heart without angina pectoris 09/03/2015     07/05/2018 - Regadenoson  MPI:  This study is borderline abnormal.  There is some partial evidence for slightly reversible uptake in the obtuse marginal circumflex distribution laterally.  Global left ventricular function at rest is mildly reduced but improves post stress.  The patient has a single 3 beat run of ventricular tachycardia during exercise without chest pain.  All segments are definitely viable.  Other high risk indicators are not noted.  10/08/2018 - Cardiac Catheterization:  Severe native coronary artery disease as described above.  Patent LIMA to the LAD and vein graft to the RCA.  Normal LVEDP.  10/10/2018 - ECHO:  Left ventricular systolic function is within normal limits.  LVEF 55-60%.  Mild concentric left ventricular hypertrophy.   Mild diastolic dysfunction.  Mild left atrial enlargement.  No significant valvular abnormalities.  Mild ascending aortic root dilation, measuring 4.1 cm.  Trace pericardial effusion.  10/13/2020 - ECHO:  Normal left ventricular cavitary dimensions with borderline LV systolic function with a visually estimated ejection fraction of ~50%.  Upper limit of normal right ventricular cavitary size and normal RV systolic function.  Unable to determine LV diastolic function secondary to underlying atrial fibrillation  Abnormal septal motion secondary to prior cardiac surgery.  No focal regional wall motion abnormality.  Mild right atrial enlargement  Mild dilation of the ascending aorta measuring 3.9 cm.  Calcified aortic valve leaflets: Aortic valve sclerosis without significant stenosis.  Mild aortic insufficiency.  No other hemodynamically significant valvular stenosis  At least moderate degenerative mitral insufficiency, eccentric and posteriorly directed.  Eccentricity may be underestimating the extent of mitral insufficiency.  Pacemaker lead identified in the right atrium  Estimated PA systolic pressure is 30 mmHg  No pericardial effusion.   10/13/2020 [facility] Pin Treadmill MPI:  This study is probably normal with no evidence of significant myocardial ischemia. Left ventricular systolic function is normal. There are no high risk prognostic indicators present.  The exercise ECG is notable for atrial fibrillation.  The study was negative for ischemia.  Demonstration of intermittent ventricular pacing in recovery.  The patient demonstrated poor exercise capacity with a normal heart rate and blood pressure response to exercise.   Compared to a prior March 2020 study.  The prior 2020 study was deemed to be borderline abnormal.  Partial evidence for a slightly reversible uptake in the obtuse marginal circumflex distribution laterally.  LV systolic function at rest was mildly reduced but improved post stress.  There was demonstration of a 3 beat run of ventricular  tachycardia with exercise though no symptoms of chest pain.  Perfusion patterns however thought to be similar to a prior 2018 study.  In comparison, there are no readily identified perfusion abnormalities on the current study.  There is demonstration of a mildly reduced ejection fraction of 44%.  Global LV hypokinesis.  Of note, the current study was done while the patient was in atrial fibrillation.  Prior studies were done in the context of a sinus rhythm.  Review of his prior June 15 device evaluation reported a presenting rhythm of a V paced/V sensed rhythm.  In aggregate the current study is intermediate risk in regards to predicted annual cardiovascular mortality rate.  Of note, the burden of risk is assigned to the patient's mildly reduced left ventricular systolic function and not burden of ischemia.  12/24/2020 - ECHO:  The left ventricular size is normal. Eccentric hypertrophy. The left ventricular systolic function is borderline. The ejection fraction by Simpson's biplane method is 50%. There are no segmental wall motion abnormalities. Cannot determine left ventricular diastolic function.  The right ventricle is mildly dilated. The right ventricular systolic function is probably normal. The estimated pulmonary artery systolic pressure is + right atrial pressure. Left Atrium: Moderately dilated.  Mitral Valve: Normal valve structure. No stenosis. Mild to moderate regurgitation.  Aortic Valve: Normal valve structure. No stenosis. Mild to moderate regurgitation. There are areas of regurgitation involving the left coronary cusp.  The aortic root and ascending aorta are normal in size.  No pericardial effusion.      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      Restless leg syndrome 01/05/2009    Family history of early CAD 01/05/2009     Family history of premature coronary artery disease -- The patient's brother died of      sudden cardiac death and another brother had coronary artery bypass graft at age 49.         Review of Systems   Constitutional: Positive for malaise/fatigue.   HENT: Negative.     Eyes: Negative.    Cardiovascular: Negative.    Respiratory: Negative.     Endocrine: Negative.    Hematologic/Lymphatic: Negative.    Skin: Negative.    Musculoskeletal:  Positive for joint pain.   Gastrointestinal: Negative.    Genitourinary: Negative.    Neurological: Negative.    Psychiatric/Behavioral: Negative.     Allergic/Immunologic: Negative.        Physical Exam    Physical Exam   General Appearance: no distress   Skin: warm, no ulcers or xanthomas   Digits and Nails: no cyanosis or clubbing   Eyes: conjunctivae and lids normal, pupils are equal and round   Teeth/Gums/Palate: dentition unremarkable, no lesions   Lips & Oral Mucosa: no pallor or cyanosis   Neck Veins: normal JVP , neck veins are not distended   Thyroid: no nodules, masses, tenderness or enlargement   Chest Inspection: chest is normal in appearance   Respiratory Effort: breathing comfortably, no respiratory distress  Auscultation/Percussion: lungs clear to auscultation, no rales or rhonchi, no wheezing   PMI: PMI not enlarged or displaced   Cardiac Rhythm: regular rhythm and normal rate   Cardiac Auscultation: S1, S2 normal, no rub, no gallop   Murmurs: no murmur   Peripheral Circulation: normal peripheral circulation   Carotid Arteries: normal carotid upstroke bilaterally, no bruits   Radial Arteries: normal symmetric radial pulses   Abdominal Aorta: no abdominal aortic bruit   Pedal Pulses: normal symmetric pedal pulses   Lower Extremity Edema: no lower extremity edema   Abdominal Exam: soft, non-tender, no masses, bowel sounds normal   Liver & Spleen: no organomegaly   Gait & Station: walks without assistance   Muscle Strength: normal muscle tone   Orientation: oriented to time, place and person   Affect & Mood: appropriate and sustained affect   Language and Memory: patient responsive and seems to comprehend information   Neurologic Exam: neurological assessment grossly intact   Other: moves all extremities      Cardiovascular Health Factors  Vitals BP Readings from Last 3 Encounters:   05/14/24 97/73   10/04/23 136/80   06/30/23 120/62     Wt Readings from Last 3 Encounters:   05/14/24 85.5 kg (188 lb 6.4 oz)   10/04/23 84.2 kg (185 lb 9.6 oz)   06/30/23 84.4 kg (186 lb)     BMI Readings from Last 3 Encounters:   05/14/24 25.55 kg/m?   10/04/23 25.17 kg/m?   06/30/23 25.23 kg/m?      Smoking Tobacco Use History[1]   Lipid Profile Cholesterol   Date Value Ref Range Status   02/14/2024 139  Final     HDL   Date Value Ref Range Status   02/14/2024 46  Final     LDL   Date Value Ref Range Status   02/14/2024 79  Final     Triglycerides   Date Value Ref Range Status   02/14/2024 70  Final      Blood Sugar No results found for: HGBA1C  Glucose   Date Value Ref Range Status   02/14/2024 90  Final   10/18/2022 98 70 - 100 MG/DL Final   93/76/7976 97 70 - 100 MG/DL Final     Glucose, POC   Date Value Ref Range Status 02/06/2017 98 70 - 100 MG/DL Final         Problems Addressed Today  Encounter Diagnoses   Name Primary?    Coronary artery disease involving native coronary artery of native heart without angina pectoris Yes    Permanent atrial fibrillation (CMS-HCC)     Pacemaker     Dyslipidemia     Essential hypertension        Assessment and Plan       Coronary artery disease involving native coronary artery of native heart without angina pectoris  1983 - First angioplasty  1992 - CAB X 2, LIMA-LAD, SVG-RCA  Multiple PCI procedures subsequently, most recently 2017.  2020 - cath showed severe native disease, patent LIMA-LAD and SVG-RCA grafts.  LM and circ OK.    06/2023 - reg/thal mildly abnormal, suggesting anterolateral ischemia.  EF 51%       Permanent atrial fibrillation (CMS-HCC)  Atrial fibrillation was first noted in 2014.  Permanent A-fib was excepted in 2020  He has a dual-chamber pacemaker which is programmed VVI at 50 and currently has 13 years on the battery.  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  Device check 03/19/24:  AF, 29% V-pacing.  Low rate set at 50, ventricular rate with AF in the 50-60 range.    Dyslipidemia  Lab Results   Component Value Date    CHOL 139 02/14/2024    TRIG 70 02/14/2024    HDL 46 02/14/2024    LDL 79 02/14/2024    VLDL 14 02/14/2024    NONHDLCHOL 70 10/18/2022    CHOLHDLC 3 02/14/2024      Atorva 80/d    Essential hypertension  Metoprolol  succinate 12.5/day.  This is really for rate control.  I re-checked his BP and it was up to 120/70.    Current Medications (including today's revisions)   acetaminophen  (TYLENOL ) 500 mg tablet Take two tablets by mouth as Needed for Pain.    aflibercept (EYLEA) 2 mg/0.05 mL intravitreal injection 0.05 mL by INTRAVITREAL route. Every 15 weeks (Patient not taking: Reported on 05/14/2024)    aspirin  EC 81 mg tablet Take one tablet by mouth daily. Hold for 7 days, resume 8/27    atorvastatin  (LIPITOR) 80 mg tablet TAKE ONE TABLET BY MOUTH EVERY DAY enoxaparin (LOVENOX) 80 mg syringe Inject 0.8 mL under the skin as directed for 3 doses. Day minus 3 evening is last dose rivaroxaban /Xarelto  (pm dose) Day minus 2 no rivaroxaban /Xarelto ,  start lovenox in the evening Day minus 1  no rivaroxaban /Xarelto , lovenox morning and evening (day0)  no rivaroxaban /Xarelto  morning of procedure/surgery. resume rivaroxaban /Xarelto  as soon as acceptable post-procedure/surgery (Patient not taking: Reported on 05/14/2024)    levothyroxine  (SYNTHROID ) 150 mcg tablet Take one tablet by mouth daily 30 minutes before breakfast. (Patient not taking: Reported on 05/14/2024)    levothyroxine  (SYNTHROID ) 175 mcg tablet Take one tablet by mouth daily.    metoprolol  succinate XL (TOPROL  XL) 25 mg extended release tablet TAKE ONE-HALF TABLET BY MOUTH EVERY DAY    nitroglycerin  (NITROSTAT ) 0.4 mg tablet Place one tablet under tongue every 5 minutes as needed for Chest Pain. Max of 3 tablets, call 911.    timolol XE(+) (TIMOPTIC-XR) 0.5 % ophthalmic gel Place one drop into or around eye(s) twice daily.    XARELTO  20 mg tablet TAKE ONE TABLET BY MOUTH EVERY DAY WITH FOOD     Total time spent on today's office visit was 45 minutes.  This includes face-to-face in person visit with patient as well as nonface-to-face time including review of the EMR, outside records, labs, radiologic studies, echocardiogram & other cardiovascular studies, formation of treatment plan, after visit summary, future disposition, and lastly on documentation.              [1]   Social History  Tobacco Use   Smoking Status Former    Current packs/day: 0.00    Average packs/day: 3.5 packs/day for 20.0 years (70.0 ttl pk-yrs)    Types: Cigarettes    Start date: 04/16/1962    Quit date: 04/16/1982    Years since quitting: 42.1   Smokeless Tobacco Never

## 2024-05-14 NOTE — Assessment & Plan Note [38]
 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  Device check 03/19/24:  AF, 29% V-pacing.  Low rate set at 50, ventricular rate with AF in the 50-60 range.

## 2024-05-14 NOTE — Assessment & Plan Note [38]
 Lab Results   Component Value Date    CHOL 139 02/14/2024    TRIG 70 02/14/2024    HDL 46 02/14/2024    LDL 79 02/14/2024    VLDL 14 02/14/2024    NONHDLCHOL 70 10/18/2022    CHOLHDLC 3 02/14/2024      Atorva 80/d

## 2024-05-17 ENCOUNTER — Encounter: Admit: 2024-05-17 | Discharge: 2024-05-17 | Payer: MEDICARE
